# Patient Record
Sex: Male | Born: 1960 | Hispanic: No | Marital: Single | Smoking: Current every day smoker
Health system: Southern US, Community
[De-identification: ages and names within clinical notes are randomized; demographics above are authoritative.]

## PROBLEM LIST (undated history)

## (undated) DIAGNOSIS — F1010 Alcohol abuse, uncomplicated: Secondary | ICD-10-CM

## (undated) HISTORY — PX: TONSILLECTOMY: SUR1361

---

## 2007-02-03 ENCOUNTER — Emergency Department (HOSPITAL_COMMUNITY): Admission: EM | Admit: 2007-02-03 | Discharge: 2007-02-03 | Payer: Self-pay | Admitting: Emergency Medicine

## 2007-02-05 ENCOUNTER — Emergency Department (HOSPITAL_COMMUNITY): Admission: EM | Admit: 2007-02-05 | Discharge: 2007-02-05 | Payer: Self-pay | Admitting: Emergency Medicine

## 2012-03-16 DIAGNOSIS — F1010 Alcohol abuse, uncomplicated: Secondary | ICD-10-CM | POA: Insufficient documentation

## 2012-03-17 ENCOUNTER — Encounter (HOSPITAL_COMMUNITY): Payer: Self-pay

## 2012-03-17 ENCOUNTER — Emergency Department (HOSPITAL_COMMUNITY)
Admission: EM | Admit: 2012-03-17 | Discharge: 2012-03-17 | Discharge status: Against Medical Advice | Payer: Self-pay | Attending: Emergency Medicine | Admitting: Emergency Medicine

## 2012-03-17 NOTE — ED Notes (Signed)
Pt decides that he doesn't want detox and just needs to go home and sleep it off.

## 2012-03-17 NOTE — ED Notes (Signed)
Pt was here just a short time ago and stated that he didn't want detox he just needed to sober up, he wasn't suicidal and didn't want to see a Dr. Now he's back and states that he does want detox

## 2012-09-23 ENCOUNTER — Encounter (HOSPITAL_BASED_OUTPATIENT_CLINIC_OR_DEPARTMENT_OTHER): Payer: Self-pay | Admitting: Family Medicine

## 2012-09-23 ENCOUNTER — Emergency Department (HOSPITAL_BASED_OUTPATIENT_CLINIC_OR_DEPARTMENT_OTHER)
Admission: EM | Admit: 2012-09-23 | Discharge: 2012-09-23 | Disposition: A | Discharge status: Home / Self Care | Payer: Self-pay | Attending: Emergency Medicine | Admitting: Emergency Medicine

## 2012-09-23 DIAGNOSIS — Y93B3 Activity, free weights: Secondary | ICD-10-CM | POA: Insufficient documentation

## 2012-09-23 DIAGNOSIS — Y9239 Other specified sports and athletic area as the place of occurrence of the external cause: Secondary | ICD-10-CM | POA: Insufficient documentation

## 2012-09-23 DIAGNOSIS — S6990XA Unspecified injury of unspecified wrist, hand and finger(s), initial encounter: Secondary | ICD-10-CM | POA: Insufficient documentation

## 2012-09-23 DIAGNOSIS — Z888 Allergy status to other drugs, medicaments and biological substances status: Secondary | ICD-10-CM | POA: Insufficient documentation

## 2012-09-23 DIAGNOSIS — S59909A Unspecified injury of unspecified elbow, initial encounter: Secondary | ICD-10-CM | POA: Insufficient documentation

## 2012-09-23 DIAGNOSIS — T148XXA Other injury of unspecified body region, initial encounter: Secondary | ICD-10-CM

## 2012-09-23 DIAGNOSIS — F17200 Nicotine dependence, unspecified, uncomplicated: Secondary | ICD-10-CM | POA: Insufficient documentation

## 2012-09-23 DIAGNOSIS — Y92838 Other recreation area as the place of occurrence of the external cause: Secondary | ICD-10-CM | POA: Insufficient documentation

## 2012-09-23 NOTE — ED Notes (Signed)
Pt sts he was lifting weights on Sunday and injured left forearm. Pt has swelling noted to left lateral forearm and c/o pain x 4 days. Pt is currently a resident at [facility].

## 2012-09-23 NOTE — ED Provider Notes (Signed)
History     CSN: [id number]  Arrival date & time 09/23/12  1317   First MD Initiated Contact with Patient 09/23/12 1347      Chief Complaint  Patient presents with  . Arm Injury    (Consider location/radiation/quality/duration/timing/severity/associated sxs/prior treatment) HPI  Patient complaining of bilateral forearm pain after lifting weights on Sunday. He states he has not lifted weights for a long time. He did lots of curls. He noticed some pain in the muscles of the forearm on Monday with some swelling in bilateral forearms on Tuesday. He states he thinks this may be where the tendon and swollen. He's main numbness or tingling in his hands. He has no fever or trauma  History reviewed. No pertinent past medical history.  Past Surgical History  Procedure Date  . Tonsillectomy     No family history on file.  History  Substance Use Topics  . Smoking status: Current Every Day Smoker  . Smokeless tobacco: Not on file  . Alcohol Use: Yes     in rehab currently      Review of Systems  Constitutional: Negative for fever and chills.  HENT: Negative for neck stiffness.   Eyes: Negative for visual disturbance.  Respiratory: Negative for shortness of breath.   Cardiovascular: Negative for chest pain.  Gastrointestinal: Negative for vomiting, diarrhea and blood in stool.  Genitourinary: Negative for dysuria, frequency and decreased urine volume.  Musculoskeletal: Negative for myalgias and joint swelling.  Skin: Negative for rash.  Neurological: Negative for weakness.  Hematological: Negative for adenopathy.  Psychiatric/Behavioral: Negative for agitation.    Allergies  Prednisone  Home Medications  No current outpatient prescriptions on file.  BP 116/70  Pulse 100  Temp 98.7 F (37.1 C) (Oral)  Resp 18  Ht 5\' 9"  (1.753 m)  Wt 148 lb (67.132 kg)  BMI 21.86 kg/m2  SpO2 99%  Physical Exam  Nursing note and vitals reviewed. Constitutional: He is oriented to  person, place, and time. He appears well-developed and well-nourished.  HENT:  Head: Normocephalic and atraumatic.  Eyes: Conjunctivae normal and EOM are normal. Pupils are equal, round, and reactive to light.  Neck: Normal range of motion. Neck supple.  Cardiovascular: Normal rate and regular rhythm.   Pulmonary/Chest: Effort normal and breath sounds normal.  Abdominal: Soft.  Musculoskeletal:       Mild bilateral swelling right forearms with mild tenderness. Some tenderness with pronation and hands bilaterally.  Neurological: He is alert and oriented to person, place, and time. He has normal reflexes.  Skin: Skin is warm and dry.  Psychiatric: He has a normal mood and affect.    ED Course  Procedures (including critical care time)  Labs Reviewed - No data to display No results found.   No diagnosis found.           Hilario Quarry, MD 09/23/12 [phone]

## 2013-02-05 ENCOUNTER — Emergency Department (HOSPITAL_COMMUNITY)
Admission: EM | Admit: 2013-02-05 | Discharge: 2013-02-05 | Discharge status: Against Medical Advice | Payer: Self-pay | Attending: Emergency Medicine | Admitting: Emergency Medicine

## 2013-02-05 DIAGNOSIS — F1010 Alcohol abuse, uncomplicated: Secondary | ICD-10-CM | POA: Insufficient documentation

## 2013-02-05 NOTE — ED Notes (Signed)
Pt BIB EMS. [place] [employer]. Pt had a half bottle of whiskey with him. Pt told EMS that he wanted detox. Pt able to ambulate with ataxic gait.

## 2014-12-01 ENCOUNTER — Inpatient Hospital Stay (HOSPITAL_COMMUNITY)
Admission: AD | Admit: 2014-12-01 | Discharge: 2014-12-05 | DRG: 897 | Disposition: A | Discharge status: Home / Self Care | Payer: Federal, State, Local not specified - Other | Source: Intra-hospital | Attending: Psychiatry | Admitting: Psychiatry

## 2014-12-01 ENCOUNTER — Emergency Department (HOSPITAL_COMMUNITY)
Admission: EM | Admit: 2014-12-01 | Discharge: 2014-12-01 | Disposition: A | Discharge status: Other Acute Inpatient Hospital | Payer: Self-pay | Attending: Emergency Medicine | Admitting: Emergency Medicine

## 2014-12-01 ENCOUNTER — Ambulatory Visit (HOSPITAL_COMMUNITY)
Admission: RE | Admit: 2014-12-01 | Discharge: 2014-12-01 | Disposition: A | Discharge status: Home / Self Care | Payer: Federal, State, Local not specified - Other | Source: Home / Self Care | Attending: Psychiatry | Admitting: Psychiatry

## 2014-12-01 ENCOUNTER — Encounter (HOSPITAL_COMMUNITY): Payer: Self-pay

## 2014-12-01 ENCOUNTER — Encounter (HOSPITAL_COMMUNITY): Payer: Self-pay | Admitting: *Deleted

## 2014-12-01 DIAGNOSIS — Y900 Blood alcohol level of less than 20 mg/100 ml: Secondary | ICD-10-CM | POA: Diagnosis present

## 2014-12-01 DIAGNOSIS — F17210 Nicotine dependence, cigarettes, uncomplicated: Secondary | ICD-10-CM | POA: Diagnosis present

## 2014-12-01 DIAGNOSIS — F10230 Alcohol dependence with withdrawal, uncomplicated: Secondary | ICD-10-CM | POA: Insufficient documentation

## 2014-12-01 DIAGNOSIS — Z720 Tobacco use: Secondary | ICD-10-CM | POA: Insufficient documentation

## 2014-12-01 DIAGNOSIS — F1010 Alcohol abuse, uncomplicated: Secondary | ICD-10-CM

## 2014-12-01 DIAGNOSIS — F10239 Alcohol dependence with withdrawal, unspecified: Secondary | ICD-10-CM | POA: Diagnosis present

## 2014-12-01 DIAGNOSIS — F419 Anxiety disorder, unspecified: Secondary | ICD-10-CM | POA: Diagnosis present

## 2014-12-01 DIAGNOSIS — Z590 Homelessness: Secondary | ICD-10-CM | POA: Diagnosis not present

## 2014-12-01 DIAGNOSIS — G4700 Insomnia, unspecified: Secondary | ICD-10-CM | POA: Diagnosis present

## 2014-12-01 DIAGNOSIS — F1020 Alcohol dependence, uncomplicated: Secondary | ICD-10-CM | POA: Diagnosis present

## 2014-12-01 HISTORY — DX: Alcohol abuse, uncomplicated: F10.10

## 2014-12-01 LAB — COMPREHENSIVE METABOLIC PANEL
ALT: 38 U/L (ref 0–53)
AST: 34 U/L (ref 0–37)
Albumin: 3.7 g/dL (ref 3.5–5.2)
Alkaline Phosphatase: 117 U/L (ref 39–117)
Anion gap: 14 (ref 5–15)
BILIRUBIN TOTAL: 0.3 mg/dL (ref 0.3–1.2)
BUN: 9 mg/dL (ref 6–23)
CALCIUM: 9.3 mg/dL (ref 8.4–10.5)
CHLORIDE: 97 meq/L (ref 96–112)
CO2: 21 meq/L (ref 19–32)
CREATININE: 0.68 mg/dL (ref 0.50–1.35)
GLUCOSE: 121 mg/dL — AB (ref 70–99)
Potassium: 4.4 mEq/L (ref 3.7–5.3)
SODIUM: 132 meq/L — AB (ref 137–147)
Total Protein: 7.2 g/dL (ref 6.0–8.3)

## 2014-12-01 LAB — RAPID URINE DRUG SCREEN, HOSP PERFORMED
AMPHETAMINES: NOT DETECTED
BARBITURATES: NOT DETECTED
BENZODIAZEPINES: NOT DETECTED
Cocaine: NOT DETECTED
Opiates: NOT DETECTED
Tetrahydrocannabinol: NOT DETECTED

## 2014-12-01 LAB — ACETAMINOPHEN LEVEL: Acetaminophen (Tylenol), Serum: <15 ug/mL (ref 10–30)

## 2014-12-01 LAB — ETHANOL: Alcohol, Ethyl (B): <11 mg/dL (ref 0–11)

## 2014-12-01 LAB — CBC
HCT: 44 % (ref 39.0–52.0)
HEMOGLOBIN: 14.7 g/dL (ref 13.0–17.0)
MCH: 30.7 pg (ref 26.0–34.0)
MCHC: 33.4 g/dL (ref 30.0–36.0)
MCV: 91.9 fL (ref 78.0–100.0)
Platelets: 293 10*3/uL (ref 150–400)
RBC: 4.79 MIL/uL (ref 4.22–5.81)
RDW: 13.1 % (ref 11.5–15.5)
WBC: 6.5 10*3/uL (ref 4.0–10.5)

## 2014-12-01 LAB — SALICYLATE LEVEL

## 2014-12-01 MED ORDER — ZOLPIDEM TARTRATE 5 MG PO TABS
5.0000 mg | ORAL_TABLET | Freq: Every evening | ORAL | Status: DC | PRN
  Administered 2014-12-02 – 2014-12-04 (×3): 5 mg via ORAL
  Filled 2014-12-01 (×4): qty 1

## 2014-12-01 MED ORDER — LORAZEPAM 1 MG PO TABS
0.0000 mg | ORAL_TABLET | Freq: Four times a day (QID) | ORAL | Status: AC
Start: 2014-12-01 — End: 2014-12-03
  Administered 2014-12-02 – 2014-12-03 (×4): 1 mg via ORAL
  Filled 2014-12-01 (×3): qty 1

## 2014-12-01 MED ORDER — THIAMINE HCL 100 MG/ML IJ SOLN: 100.0000 mg | Freq: Every day | INTRAMUSCULAR | Status: DC

## 2014-12-01 MED ORDER — ZOLPIDEM TARTRATE 5 MG PO TABS: 5.0000 mg | ORAL_TABLET | Freq: Every evening | ORAL | Status: DC | PRN

## 2014-12-01 MED ORDER — ALUM & MAG HYDROXIDE-SIMETH 200-200-20 MG/5ML PO SUSP: 30.0000 mL | ORAL | Status: DC | PRN

## 2014-12-01 MED ORDER — LORAZEPAM 2 MG/ML IJ SOLN: 0.0000 mg | Freq: Four times a day (QID) | INTRAMUSCULAR | Status: DC

## 2014-12-01 MED ORDER — VITAMIN B-1 100 MG PO TABS
100.0000 mg | ORAL_TABLET | Freq: Every day | ORAL | Status: DC
  Administered 2014-12-02 – 2014-12-05 (×4): 100 mg via ORAL
  Filled 2014-12-01 (×6): qty 1

## 2014-12-01 MED ORDER — ONDANSETRON HCL 4 MG PO TABS: 4.0000 mg | ORAL_TABLET | Freq: Three times a day (TID) | ORAL | Status: DC | PRN

## 2014-12-01 MED ORDER — NICOTINE 21 MG/24HR TD PT24
21.0000 mg | MEDICATED_PATCH | Freq: Every day | TRANSDERMAL | Status: DC
  Administered 2014-12-02: 21 mg via TRANSDERMAL
  Filled 2014-12-01 (×4): qty 1

## 2014-12-01 MED ORDER — MAGNESIUM HYDROXIDE 400 MG/5ML PO SUSP: 30.0000 mL | Freq: Every day | ORAL | Status: DC | PRN

## 2014-12-01 MED ORDER — LORAZEPAM 1 MG PO TABS
0.0000 mg | ORAL_TABLET | Freq: Two times a day (BID) | ORAL | Status: AC
Start: 2014-12-03 — End: 2014-12-05
  Administered 2014-12-03 – 2014-12-05 (×4): 1 mg via ORAL
  Filled 2014-12-01 (×5): qty 1

## 2014-12-01 MED ORDER — LORAZEPAM 1 MG PO TABS: 0.0000 mg | ORAL_TABLET | Freq: Two times a day (BID) | ORAL | Status: DC

## 2014-12-01 MED ORDER — IBUPROFEN 200 MG PO TABS: 600.0000 mg | ORAL_TABLET | Freq: Three times a day (TID) | ORAL | Status: DC | PRN

## 2014-12-01 MED ORDER — ACETAMINOPHEN 325 MG PO TABS: 650.0000 mg | ORAL_TABLET | ORAL | Status: DC | PRN

## 2014-12-01 MED ORDER — LORAZEPAM 1 MG PO TABS
1.0000 mg | ORAL_TABLET | Freq: Three times a day (TID) | ORAL | Status: DC | PRN
  Administered 2014-12-02 – 2014-12-03 (×2): 1 mg via ORAL

## 2014-12-01 MED ORDER — ACETAMINOPHEN 325 MG PO TABS
650.0000 mg | ORAL_TABLET | Freq: Four times a day (QID) | ORAL | Status: DC | PRN
  Administered 2014-12-03: 650 mg via ORAL
  Filled 2014-12-01: qty 2

## 2014-12-01 MED ORDER — NICOTINE 21 MG/24HR TD PT24: 21.0000 mg | MEDICATED_PATCH | Freq: Every day | TRANSDERMAL | Status: DC

## 2014-12-01 MED ORDER — LORAZEPAM 2 MG/ML IJ SOLN: 0.0000 mg | Freq: Two times a day (BID) | INTRAMUSCULAR | Status: DC

## 2014-12-01 MED ORDER — VITAMIN B-1 100 MG PO TABS: 100.0000 mg | ORAL_TABLET | Freq: Every day | ORAL | Status: DC

## 2014-12-01 MED ORDER — LORAZEPAM 1 MG PO TABS: 0.0000 mg | ORAL_TABLET | Freq: Four times a day (QID) | ORAL | Status: DC

## 2014-12-01 MED ORDER — LORAZEPAM 1 MG PO TABS: 1.0000 mg | ORAL_TABLET | Freq: Three times a day (TID) | ORAL | Status: DC | PRN

## 2014-12-01 NOTE — BH Assessment (Addendum)
Tele Assessment Note   Jay Potts is an 53 y.o. male who [facility].  He says he has been drinking between 12-20 high content beers daily for the past few months. Pt state she also smoked crack and marijuana a few times int he past few weeks, which he says he never does.  Pt denies history of seizures or DTs, and last drank at 2 am this morning.  He says he has [facility], and then went to AA, but he "does not fit in there'. He has no OP providers.  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  He has little family support because his parents are in an assisted living facility, and all of his siblings live in [place].  Pt denies depressive symptoms, says he has normal sleep and eating patterns and denies A/V hallucinations.  He was calm and cooperative during assessment with normal speech pattern, movement and thought content and did not appear to responding to internal stimuli.  Freeman CaldronShelli, NP, [facility] and [facility].  Axis I: Alcohol Abuse Axis II: Deferred Axis III: No past medical history on file. Axis IV: economic problems, housing problems and problems with primary support group Axis V: 41-50 serious symptoms  Past Medical History: No past medical history on file.  Past Surgical History  Procedure Laterality Date  . Tonsillectomy      Family History: No family history on file.  Social History:  reports that he has been smoking.  He does not have any smokeless tobacco history on file. He reports that he drinks alcohol. He reports that he uses illicit drugs (Marijuana).  Additional Social History:  Alcohol / Drug Use Pain Medications: denies Prescriptions: denies Over the Counter: denies History of alcohol / drug use?: Yes Longest period of sobriety (when/how long): 5 years Negative Consequences of Use: Financial, Personal relationships,  Work / School Withdrawal Symptoms:  (none currently) Substance #1 Name of Substance 1: alcohol 1 - Age of First Use: 16 1 - Amount (size/oz): 12-20 high alcohol beers/day 1 - Frequency: daily 1 - Duration: months 1 - Last Use / Amount: from 6pm to 2am, @20  beers  CIWA:   COWS:    PATIENT STRENGTHS: (choose at least two) Ability for insight Average or above average intelligence Capable of independent living Psychologist, counsellingCommunication skills Financial means Work skills  Allergies:  Allergies  Allergen Reactions  . Prednisone     Home Medications:  (Not in a hospital admission)  OB/GYN Status:  No LMP for male patient.  General Assessment Data Location of Assessment: [facility] Assessment Services Is this an Initial Assessment or a Re-assessment for this encounter?: Initial Assessment Living Arrangements: Alone (homeless) Can pt return to current living arrangement?: Yes Admission Status: Voluntary Is patient capable of signing voluntary admission?: Yes Transfer from: Home Referral Source: Self/Family/Friend  Medical Screening Exam [facility] Walk-in ONLY) Medical Exam completed: No Reason for MSE not completed:  ([facility])  [facility] Living Arrangements: Alone (homeless) Name of Psychiatrist:  (none) Name of Therapist: none  Education Status Is patient currently in school?: No  Risk to self with the past 6 months Suicidal Ideation: No Suicidal Intent: No Is patient at risk for suicide?: No Suicidal Plan?: No Access to Means: No What has been your use of drugs/alcohol within the  last 12 months?:  (see SA section) Previous Attempts/Gestures: No Other Self Harm Risks:  (none known) Intentional Self Injurious Behavior: None Family Suicide History: No Recent stressful life event(s): Financial Problems, Job Loss Persecutory voices/beliefs?: No Depression: Yes Depression Symptoms:  (pt denies) Substance abuse history and/or treatment for  substance abuse?: Yes Suicide prevention information given to non-admitted patients: Not applicable  Risk to Others within the past 6 months Homicidal Ideation: No Thoughts of Harm to Others: No Current Homicidal Intent: No Current Homicidal Plan: No Access to Homicidal Means: No History of harm to others?: No Assessment of Violence: None Noted Does patient have access to weapons?: No Criminal Charges Pending?: No Does patient have a court date: No  Psychosis Hallucinations: None noted Delusions: None noted  Mental Status Report Appear/Hygiene: Disheveled Eye Contact: Good Motor Activity: Unremarkable Speech: Logical/coherent Level of Consciousness: Alert Mood:  (appropriate to circumstance) Affect: Appropriate to circumstance Anxiety Level: None Thought Processes: Coherent, Relevant Judgement: Partial Orientation: Person, Place, Time, Situation, Appropriate for developmental age Obsessive Compulsive Thoughts/Behaviors: None  Cognitive Functioning Concentration: Normal Memory: Recent Intact, Remote Intact IQ: Average Insight: Fair Impulse Control: Fair Appetite: Fair Weight Loss: 0 Weight Gain: 0 Sleep: No Change Total Hours of Sleep: 8 Vegetative Symptoms: Decreased grooming  ADLScreening [facility] Assessment Services) Patient's cognitive ability adequate to safely complete daily activities?: Yes Patient able to express need for assistance with ADLs?: Yes Independently performs ADLs?: Yes (appropriate for developmental age)  Prior Inpatient Therapy Prior Inpatient Therapy: Yes Prior Therapy Dates:  (June 2015) Prior Therapy Facilty/Provider(s):  ([facility]) Reason for Treatment:  (detox)  Prior Outpatient Therapy Prior Outpatient Therapy: Yes ([facility]) Prior Therapy Dates:  (June 2015-present') Prior Therapy Facilty/Provider(s):  ([facility]) Reason for Treatment:  (ETOH)  ADL Screening (condition at time of admission) Patient's cognitive ability adequate to safely complete  daily activities?: Yes Is the patient deaf or have difficulty hearing?: No Does the patient have difficulty seeing, even when wearing glasses/contacts?: No Does the patient have difficulty concentrating, remembering, or making decisions?: No Patient able to express need for assistance with ADLs?: Yes Does the patient have difficulty dressing or bathing?: No Independently performs ADLs?: Yes (appropriate for developmental age) Does the patient have difficulty walking or climbing stairs?: No  Home Assistive Devices/Equipment Home Assistive Devices/Equipment: None    Abuse/Neglect Assessment (Assessment to be complete while patient is alone) Physical Abuse: Denies Verbal Abuse: Denies Sexual Abuse: Denies Exploitation of patient/patient's resources: Denies Self-Neglect: Denies          Additional Information 1:1 In Past 12 Months?: No CIRT Risk: No Elopement Risk: No Does patient have medical clearance?: No     Disposition:  Disposition Initial Assessment Completed for this Encounter: Yes Disposition of Patient: Inpatient treatment program  [facility],Lindon Kiel Hines 12/01/2014 11:47 AM

## 2014-12-01 NOTE — Tx Team (Signed)
Initial Interdisciplinary Treatment Plan   PATIENT STRESSORS: Financial difficulties Substance abuse   PATIENT STRENGTHS: Ability for insight Average or above average intelligence Communication skills   PROBLEM LIST: Problem List/Patient Goals Date to be addressed Date deferred Reason deferred Estimated date of resolution  Alcohol abuse 12/01/2014                                                      DISCHARGE CRITERIA:  Verbal commitment to aftercare and medication compliance Withdrawal symptoms are absent or subacute and managed without 24-hour nursing intervention  PRELIMINARY DISCHARGE PLAN: Attend aftercare/continuing care group  PATIENT/FAMIILY INVOLVEMENT: This treatment plan has been presented to and reviewed with the patient, Jay Potts.  The patient and family have been given the opportunity to ask questions and make suggestions.  Jay Potts, Jay Potts 12/01/2014, 7:03 PM

## 2014-12-01 NOTE — BH Assessment (Signed)
[facility].   TTS Elijah Birkom will fax the support paperwork.

## 2014-12-01 NOTE — BH Assessment (Signed)
[facility] Assessment Progress Note  Jay Potts, Jay Potts was informed by Phillip HealAva Stevenson, Jay Potts that pt has been [facility], Rm 301-1, by Bonnetta BarryShelly Eisbach, NP.  Pt signed Voluntary Admission and Consent for Treatment, as well as Consent to Release Information.  S[facility].  Pt's nurse, Carlisle BeersLuann, has been informed.  She agrees to send original paperwork with pt via Juel [place], and to call report to [phone].  Doylene Canninghomas Bailee Metter, MA Triage Specialist 12/01/2014 @ 14:27

## 2014-12-01 NOTE — BH Assessment (Addendum)
Per Burnett HarryShelly, NP patient meets criteria for inpatient hospitalization.   TTS Irving Burtonmily will refer patient to [facility] and [facility].   [facility] been informed that the patient is there.  TTS Elijah Birkom will follow up on the status of referral.

## 2014-12-01 NOTE — Progress Notes (Signed)
Patient attended AA group tonight. 

## 2014-12-01 NOTE — Progress Notes (Signed)
D.  Pt pleasant on approach, denies withdrawal signs or symptoms at this time.  Denies SI/HI/hallucinations at this time.  Positive for evening AA group, interacting appropriately with peers on the unit.  A.  Support and encouragement offered  R.  Pt remains safe on unit, will continue to monitor.

## 2014-12-01 NOTE — ED Notes (Signed)
Pt reports intermittent alcohol abuse x30. Went to rehab 6 months ago. Has been drink x5 months. Lately has been drinking 12-18 drinks/day. Smoked crack cocaine last night. Smokes marijauna 1x/month. Denies pain. Last ETOH at 0200. Reports usually can go 2 days without drinking and on third day his "body starts knotting up".

## 2014-12-01 NOTE — Progress Notes (Signed)
Pt admitted to unit. Skin search unremarkable -- some possible toe fungus, scratches around ankle (pt thinks from detergent that irritated skin). Belongings inventoried, rules reviewed, and meal given. Pt denies SI, HI, a/v disturbances. Says his only complaint is substance abuse -- here for alcohol detox. CIWA 1 upon admission for slight tremor. ("You should have seen me this morning," pt says.) Pt is cooperative, polite, and pleasant. Pt reports no hx of seizures and says his worst withdrawal symptoms come about 3 days after not drinking.

## 2014-12-01 NOTE — ED Provider Notes (Signed)
CSN: [id number]     Arrival date & time 12/01/14  1138 History   First MD Initiated Contact with Patient 12/01/14 1204     Chief Complaint  Patient presents with  . ETOH detox      (Consider location/radiation/quality/duration/timing/severity/associated sxs/prior Treatment) Patient is a 53 y.o. male presenting with alcohol problem. The history is provided by the patient.  Alcohol Problem This is a chronic problem. The current episode started more than 1 week ago. The problem occurs constantly. The problem has been gradually worsening. Pertinent negatives include no abdominal pain and no shortness of breath. Nothing aggravates the symptoms. Nothing relieves the symptoms. He has tried nothing for the symptoms.    Past Medical History  Diagnosis Date  . Alcohol abuse    Past Surgical History  Procedure Laterality Date  . Tonsillectomy     History reviewed. No pertinent family history. History  Substance Use Topics  . Smoking status: Current Every Day Smoker  . Smokeless tobacco: Not on file  . Alcohol Use: Yes     Comment: ETOH abuse x30 years, here for med clearance Dec 2015    Review of Systems  Constitutional: Negative for fever and chills.  Respiratory: Negative for cough and shortness of breath.   Gastrointestinal: Negative for vomiting and abdominal pain.  All other systems reviewed and are negative.     Allergies  Prednisone  Home Medications   Prior to Admission medications   Not on File   BP 142/84 mmHg  Pulse 64  Temp(Src) 98.2 F (36.8 C) (Oral)  Resp 16  SpO2 98% Physical Exam  Constitutional: He is oriented to person, place, and time. He appears well-developed and well-nourished. No distress.  HENT:  Head: Normocephalic and atraumatic.  Mouth/Throat: Oropharynx is clear and moist. No oropharyngeal exudate.  Eyes: EOM are normal. Pupils are equal, round, and reactive to light.  Neck: Normal range of motion. Neck supple.  Cardiovascular: Normal  rate and regular rhythm.  Exam reveals no friction rub.   No murmur heard. Pulmonary/Chest: Effort normal and breath sounds normal. No respiratory distress. He has no wheezes. He has no rales.  Abdominal: Soft. He exhibits no distension. There is no tenderness. There is no rebound.  Musculoskeletal: Normal range of motion. He exhibits no edema.  Neurological: He is alert and oriented to person, place, and time. No cranial nerve deficit. He exhibits normal muscle tone. Coordination normal.  Skin: No rash noted. He is not diaphoretic.  Nursing note and vitals reviewed.   ED Course  Procedures (including critical care time) Labs Review Labs Reviewed  COMPREHENSIVE METABOLIC PANEL - Abnormal; Notable for the following:    Sodium 132 (*)    Glucose, Bld 121 (*)    All other components within normal limits  SALICYLATE LEVEL - Abnormal; Notable for the following:    Salicylate Lvl <2.0 (*)    All other components within normal limits  ACETAMINOPHEN LEVEL  CBC  ETHANOL  URINE RAPID DRUG SCREEN (HOSP PERFORMED)    Imaging Review No results found.   EKG Interpretation None      MDM   Final diagnoses:  None    45M here wanting alcohol detox. Did inpatient program earlier this year, has been drinking heavily for several months. States relapse was because he stopped going to meetings. AFVSS here. No hx of seizures with alcohol withdrawal, just severe shakiness. Will consult TTS. CIWA protocol initiated.    Elwin MochaBlair Bert Ptacek, MD 12/01/14 [phone]

## 2014-12-02 ENCOUNTER — Encounter (HOSPITAL_COMMUNITY): Payer: Self-pay | Admitting: Psychiatry

## 2014-12-02 DIAGNOSIS — F1010 Alcohol abuse, uncomplicated: Secondary | ICD-10-CM

## 2014-12-02 NOTE — [facility] Suicide Risk Assessment (Signed)
Suicide Risk Assessment  Admission Assessment     Nursing information obtained from:    Demographic factors:    Current Mental Status:    Loss Factors:    Historical Factors:    Risk Reduction Factors:    Total Time spent with patient: 45 minutes  CLINICAL FACTORS:   Alcohol/Substance Abuse/Dependencies  Psychiatric Specialty Exam:     Blood pressure 118/58, pulse 61, temperature 98.7 F (37.1 C), temperature source Oral, resp. rate 18, height 5' 7.32" (1.71 m), weight 69.174 kg (152 lb 8 oz), SpO2 100 %.Body mass index is 23.66 kg/(m^2).  General Appearance: Fairly Groomed  Patent attorneyye Contact::  Fair  Speech:  Clear and Coherent  Volume:  Normal  Mood:  Anxious and worried  Affect:  anxious, worried  Thought Process:  Coherent and Goal Directed  Orientation:  Full (Time, Place, and Person)  Thought Content:  symptoms events worries concerns  Suicidal Thoughts:  No  Homicidal Thoughts:  No  Memory:  Immediate;   Fair Recent;   Fair Remote;   Fair  Judgement:  Fair  Insight:  Present  Psychomotor Activity:  Restlessness  Concentration:  Fair  Recall:  FiservFair  Fund of Knowledge:Fair  Language: Fair  Akathisia:  No  Handed:    AIMS (if indicated):     Assets:  Desire for Improvement  Sleep:  Number of Hours: 6.75   Musculoskeletal: Strength & Muscle Tone: within normal limits Gait & Station: normal Patient leans: N/A  COGNITIVE FEATURES THAT CONTRIBUTE TO RISK:  Closed-mindedness Polarized thinking Thought constriction (tunnel vision)    SUICIDE RISK:   Moderate:   PLAN OF CARE: Supportive approach/coping skills/relapse prevention                               Alcohol dependence: will detox with Ativan, will present the use of Campral to help the cravings                               Will reassess and address any other co morbidities  I certify that inpatient services furnished can reasonably be expected to improve the patient's condition.  Tieisha Darden  A 12/02/2014, 5:42 PM

## 2014-12-02 NOTE — Progress Notes (Signed)
Adult Psychoeducational Group Note  Date:  12/02/2014 Time:  6:20 PM  Group Topic/Focus: Music Activity   Participation Level:  Active  Participation Quality:  Appropriate  Affect:  Appropriate  Cognitive:  Appropriate  Insight: Appropriate  Engagement in Group:  Engaged  Modes of Intervention:  Activity  Additional Comments:   Jay Potts A 12/02/2014, 6:20 PM

## 2014-12-02 NOTE — [facility] Group Notes (Signed)
[facility] LCSW Group Therapy Note  12/02/2014 1:15 PM   Type of Therapy and Topic:  Group Therapy: Avoiding Self-Sabotaging and Enabling Behaviors  Participation Level:  Moderate  Mood: Appropriate  Description of Group:     Learn how to identify obstacles, self-sabotaging and enabling behaviors, what are they, why do we do them and what needs do these behaviors meet? Discuss unhealthy relationships and how to have positive healthy boundaries with those that sabotage and enable. Explore aspects of self-sabotage and enabling in yourself and how to limit these self-destructive behaviors in everyday life. A scaling question is used to help patient look at where they are now in their motivation to change, from 1 to 10 (lowest to highest motivation).  Therapeutic Goals: 1. Patient will identify one obstacle that relates to self-sabotage and enabling behaviors 2. Patient will identify one personal self-sabotaging or enabling behavior they did prior to admission 3. Patient able to establish a plan to change the above identified behavior they did prior to admission:  4. Patient will demonstrate ability to communicate their needs through discussion and/or role plays.   Summary of Patient Progress:  Summary of Progress/Problems: The main focus of today's process group was for the patient to identify ways in which they have in the past sabotaged their own recovery. Motivational Interviewing was utilized to ask the group members what they get out of their substance use, isolation, noncompliance, self harm, negative self talk, etc and what reasons they may have for wanting to change. The Stages of Change were explained using a handout, and patients identified where they currently are with regard to stages of change. The patient expressed that he self sabotages by alcohol use and he feels quite hopeless about this although he reported during warm up his desire to return to school. He reports he is in  contemplation and hopes to get into another substance abuse treatment program.    Therapeutic Modalities:   Cognitive Behavioral Therapy Person-Centered Therapy Motivational Interviewing   Carney Bernatherine C Micala Saltsman, LCSW

## 2014-12-02 NOTE — Progress Notes (Signed)
[facility] Group Notes:  (Nursing/MHT/Case Management/Adjunct)  Date:  12/02/2014  Time:  11:58 PM  Type of Therapy:  Psychoeducational Skills  Participation Level:  Minimal  Participation Quality:  Appropriate  Affect:  Appropriate  Cognitive:  Appropriate  Insight:  Appropriate  Engagement in Group:  Developing/Improving  Modes of Intervention:  Education  Summary of Progress/Problems: The patient attended the A.A. Meeting and was appropriate.   Hazle CocaGOODMAN, Tamie Minteer S 12/02/2014, 11:58 PM

## 2014-12-02 NOTE — H&P (Signed)
Psychiatric Admission Assessment Adult  Patient Identification:  Jay Potts Date of Evaluation:  12/02/2014 Chief Complaint:  ALCOHOL ABUSE History of Present Illness::  Caucasian male, 53 years old was evaluated this am for Alcohol dependence issue.  Patient started drinking off and on at age 40.  He has been treated for Alcoholism at [facility] and [facility].  Patient was at [facility] early this year  In July but relapsed and started drinking  Two weeks after his release.  Patient reports drinking 12-14  Beers daily.  Patient stated that he tried using Cocaine once in his life last week with friends and states that he will not use it again.  He uses Marijuana once a month only when drinking with his friends.  Patient denies Alcohol withdrawal seizures but stated that he drinks until he falls asleep at times.  Patient reports good appetite and sleep.  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  He reported that his brother had problem with drinking and had Liver failure.  He ended up receiving a Liver transplant.  Patien plans to join [place] meeting after his discharge from here.  Patients that he has a better attitude toward recovery now and does not want to be like his brother.  He denies SI/HI/AVH.  He denies previous suicide attempt.  He is being detoxed with our Ativan protocol.  He denies previous MH diagnosis and is not taking any medications.  Elements:  Location:  Alcohol dependence. Quality:  DRINKS 12-14 BEER DAILY, IS HOMELESS, LOST JOB, LOST CONTACT WITH FAMILY.Marland Kitchen Severity:  Severe. Duration:  Have been drinking off and on since age 68. Context:  Seeking detox treatment, want to change this life style.. Associated Signs/Synptoms: Depression Symptoms:   (Hypo) Manic Symptoms:   Anxiety Symptoms:   Psychotic Symptoms:   PTSD Symptoms:  Total Time spent with patient: 1 hour  Psychiatric Specialty Exam: Physical Exam  Review of Systems  Constitutional:  Negative.   HENT: Negative.   Eyes: Negative.   Respiratory: Negative.   Cardiovascular: Negative.   Gastrointestinal: Negative.   Genitourinary: Negative.   Musculoskeletal: Negative.   Skin: Negative.   Neurological: Negative.   Endo/Heme/Allergies: Negative.   Psychiatric/Behavioral: Positive for substance abuse (Have had issues with drinking and recently used Cocaine and Marijuana). Negative for depression, suicidal ideas, hallucinations and memory loss. The patient is not nervous/anxious and does not have insomnia.     Blood pressure 116/69, pulse 80, temperature 98.7 F (37.1 C), temperature source Oral, resp. rate 18, height 5' 7.32" (1.71 m), weight 69.174 kg (152 lb 8 oz), SpO2 100 %.Body mass index is 23.66 kg/(m^2).  General Appearance: Casual  Eye Contact::  Good  Speech:  Clear and Coherent and Normal Rate  Volume:  Normal  Mood:  Depressed, Hopeless and Worthless  Affect:  Congruent, Depressed and Flat  Thought Process:  Coherent, Goal Directed and Intact  Orientation:  Full (Time, Place, and Person)  Thought Content:  WDL  Suicidal Thoughts:  No  Homicidal Thoughts:  No  Memory:  Immediate;   Good Recent;   Good Remote;   Good  Judgement:  Fair  Insight:  Fair  Psychomotor Activity:  Tremor  Concentration:  Good  Recall:  NA  Fund of Knowledge:Good  Language: Good  Akathisia:  NA  Handed:  Right  AIMS (if indicated):     Assets:  Desire for Improvement Housing  Sleep:  Number of Hours: 6.75  Musculoskeletal: Strength & Muscle Tone: within normal limits Gait & Station: normal Patient leans: N/A  Past Psychiatric History: Diagnosis:  Hospitalizations:  Outpatient Care: none  Substance Abuse Care: [facility], [facility]  Self-Mutilation: Denies  Suicidal Attempts: Denies  Violent Behaviors: Denies   Past Medical History:   Past Medical History  Diagnosis Date  . Alcohol abuse    None. Allergies:   Allergies  Allergen Reactions  . Prednisone  Palpitations   PTA Medications: No prescriptions prior to admission    Previous Psychotropic Medications:  Medication/Dose                 Substance Abuse History in the last 12 months:  Yes.    Consequences of Substance Abuse: NA  Social History:  reports that he has been smoking.  He does not have any smokeless tobacco history on file. He reports that he drinks alcohol. He reports that he uses illicit drugs (Marijuana and Cocaine). Additional Social History:  Current Place of Residence:   Place of Birth:   Family Members: Marital Status:  Single Children:  Sons:  Daughters: Relationships: Education:  [employer] Problems/Performance: Religious Beliefs/Practices: History of Abuse (Emotional/Phsycial/Sexual) Ship broker History:  None. Legal History: Hobbies/Interests:  Family History:  History reviewed. No pertinent family history.  Results for orders placed or performed during the hospital encounter of 12/01/14 (from the past 72 hour(s))  Acetaminophen level     Status: None   Collection Time: 12/01/14 12:03 PM  Result Value Ref Range   Acetaminophen (Tylenol), Serum <15.0 10 - 30 ug/mL    Comment:        THERAPEUTIC CONCENTRATIONS VARY SIGNIFICANTLY. A RANGE OF 10-30 ug/mL MAY BE AN EFFECTIVE CONCENTRATION FOR MANY PATIENTS. HOWEVER, SOME ARE BEST TREATED AT CONCENTRATIONS OUTSIDE THIS RANGE. ACETAMINOPHEN CONCENTRATIONS >150 ug/mL AT 4 HOURS AFTER INGESTION AND >50 ug/mL AT 12 HOURS AFTER INGESTION ARE OFTEN ASSOCIATED WITH TOXIC REACTIONS.   CBC     Status: None   Collection Time: 12/01/14 12:03 PM  Result Value Ref Range   WBC 6.5 4.0 - 10.5 K/uL   RBC 4.79 4.22 - 5.81 MIL/uL   Hemoglobin 14.7 13.0 - 17.0 g/dL   HCT 44.0 39.0 - 52.0 %   MCV 91.9 78.0 - 100.0 fL   MCH 30.7 26.0 - 34.0 pg   MCHC 33.4 30.0 - 36.0 g/dL   RDW 13.1 11.5 - 15.5 %   Platelets 293 150 - 400 K/uL  Comprehensive metabolic panel      Status: Abnormal   Collection Time: 12/01/14 12:03 PM  Result Value Ref Range   Sodium 132 (L) 137 - 147 mEq/L   Potassium 4.4 3.7 - 5.3 mEq/L   Chloride 97 96 - 112 mEq/L   CO2 21 19 - 32 mEq/L   Glucose, Bld 121 (H) 70 - 99 mg/dL   BUN 9 6 - 23 mg/dL   Creatinine, Ser 0.68 0.50 - 1.35 mg/dL   Calcium 9.3 8.4 - 10.5 mg/dL   Total Protein 7.2 6.0 - 8.3 g/dL   Albumin 3.7 3.5 - 5.2 g/dL   AST 34 0 - 37 U/L   ALT 38 0 - 53 U/L   Alkaline Phosphatase 117 39 - 117 U/L   Total Bilirubin 0.3 0.3 - 1.2 mg/dL   GFR calc non Af Amer >90 >90 mL/min   GFR calc Af Amer >90 >90 mL/min    Comment: (NOTE) The eGFR has been calculated using the CKD  EPI equation. This calculation has not been validated in all clinical situations. eGFR's persistently <90 mL/min signify possible Chronic Kidney Disease.    Anion gap 14 5 - 15  Ethanol (ETOH)     Status: None   Collection Time: 12/01/14 12:03 PM  Result Value Ref Range   Alcohol, Ethyl (B) <11 0 - 11 mg/dL    Comment:        LOWEST DETECTABLE LIMIT FOR SERUM ALCOHOL IS 11 mg/dL FOR MEDICAL PURPOSES ONLY   Salicylate level     Status: Abnormal   Collection Time: 12/01/14 12:03 PM  Result Value Ref Range   Salicylate Lvl <7.8 (L) 2.8 - 20.0 mg/dL  Urine Drug Screen     Status: None   Collection Time: 12/01/14 12:12 PM  Result Value Ref Range   Opiates NONE DETECTED NONE DETECTED   Cocaine NONE DETECTED NONE DETECTED   Benzodiazepines NONE DETECTED NONE DETECTED   Amphetamines NONE DETECTED NONE DETECTED   Tetrahydrocannabinol NONE DETECTED NONE DETECTED   Barbiturates NONE DETECTED NONE DETECTED    Comment:        DRUG SCREEN FOR MEDICAL PURPOSES ONLY.  IF CONFIRMATION IS NEEDED FOR ANY PURPOSE, NOTIFY LAB WITHIN 5 DAYS.        LOWEST DETECTABLE LIMITS FOR URINE DRUG SCREEN Drug Class       Cutoff (ng/mL) Amphetamine      1000 Barbiturate      200 Benzodiazepine   938 Tricyclics       101 Opiates          300 Cocaine           300 THC              50    Psychological Evaluations:  Assessment:   DSM5:  Schizophrenia Disorders:   Obsessive-Compulsive Disorders:   Trauma-Stressor Disorders:  Substance/Addictive Disorders:  Alcohol Related Disorder - Severe (303.90) Depressive Disorders:      Past Medical History  Diagnosis Date  . Alcohol abuse      Treatment Plan/Recommendations:   Admit for crisis management/stabilization. Review and reinstate any pertinent home medications for other health issues.   Medication management to treat current mood problems Continue Alcohol detox with our Ativan Protocol Group counseling sessions and activities. Primary care consults as needed. Continue current treatment plan .  Treatment Plan Summary: Daily contact with patient to assess and evaluate symptoms and progress in treatment Medication management Current Medications:  Current Facility-Administered Medications  Medication Dose Route Frequency Provider Last Rate Last Dose  . acetaminophen (TYLENOL) tablet 650 mg  650 mg Oral Q6H PRN Waylan Boga, NP      . alum & mag hydroxide-simeth (MAALOX/MYLANTA) 200-200-20 MG/5ML suspension 30 mL  30 mL Oral Q4H PRN Waylan Boga, NP      . LORazepam (ATIVAN) tablet 0-4 mg  0-4 mg Oral 4 times per day Waylan Boga, NP   1 mg at 12/02/14 7510   Followed by  . [START ON 12/03/2014] LORazepam (ATIVAN) tablet 0-4 mg  0-4 mg Oral Q12H Waylan Boga, NP      . LORazepam (ATIVAN) tablet 1 mg  1 mg Oral Q8H PRN Waylan Boga, NP      . magnesium hydroxide (MILK OF MAGNESIA) suspension 30 mL  30 mL Oral Daily PRN Waylan Boga, NP      . nicotine (NICODERM CQ - dosed in mg/24 hours) patch 21 mg  21 mg Transdermal Daily Waylan Boga, NP      .  ondansetron (ZOFRAN) tablet 4 mg  4 mg Oral Q8H PRN Waylan Boga, NP      . thiamine (VITAMIN B-1) tablet 100 mg  100 mg Oral Daily Waylan Boga, NP       Or  . thiamine (B-1) injection 100 mg  100 mg Intravenous Daily Waylan Boga, NP       . zolpidem (AMBIEN) tablet 5 mg  5 mg Oral QHS PRN Waylan Boga, NP        Observation Level/Precautions:  15 minute checks  Laboratory:  CBC Chemistry Profile HbAIC UDS Alcohol level  Psychotherapy:  Encouraged to attend group therapy  Medications:    Consultations:  As needed  Discharge Concerns:  Relapse on Alcohol  Estimated LOS: 2-5 days  Other:     I certify that inpatient services furnished can reasonably be expected to improve the patient's condition.   Charmaine Downs, C  PMHNP-BC 12/19/201510:25 AM I personally assessed the patient, reviewed the physical exam and labs and formulated the treatment plan Geralyn Flash A. Sabra Heck, M.D.

## 2014-12-02 NOTE — Progress Notes (Signed)
D.  Pt pleasant on approach, states he thinks he will try something tonight for sleep.  Denies SI/HI/hallucinations or s/s of withdrawal at this time.  Positive for evening AA group, interacting appropriately with peers on unit.  A.  Support and encouragement offered, medication given as ordered to promote sleep.  R.  Pt remains safe on unit, will continue to monitor.

## 2014-12-02 NOTE — Progress Notes (Signed)
Jay Potts is handling his detox well today. He is interacting with the other patients. He completes his self inventory and on it he wrote  He denies SI within the past 24 hrs and he rated his depression, hopelessness and anxiety "0/0/2", respectively.   A HE attends his groups as planned today and has not requested extra medicine for his withdrawal. HE stated he was in a place of " mellow'", after receivin his first dose of ativan this morning.   R He us beginning to be honest about his illness as evidienced by introducing himslef as an " alcoholic who needs help". Safety in place.Marland Kitchen..Marland Kitchen

## 2014-12-03 DIAGNOSIS — F1020 Alcohol dependence, uncomplicated: Secondary | ICD-10-CM

## 2014-12-03 MED ORDER — NICOTINE 21 MG/24HR TD PT24
21.0000 mg | MEDICATED_PATCH | Freq: Every day | TRANSDERMAL | Status: DC
  Administered 2014-12-03 – 2014-12-05 (×3): 21 mg via TRANSDERMAL
  Filled 2014-12-03 (×5): qty 1

## 2014-12-03 NOTE — [facility] Group Notes (Signed)
[facility] Group Notes:  (Nursing/MHT/Case Management/Adjunct)  Date:  12/03/2014  Time:  1030am  Type of Therapy:  Life Skills Group  Participation Level:  Active  Participation Quality:  Appropriate and Attentive  Affect:  Appropriate  Cognitive:  Alert and Appropriate  Insight:  Appropriate and Good  Engagement in Group:  Engaged  Modes of Intervention:  Discussion, Education and Support  Summary of Progress/Problems: Patient was engaged in group and responded appropriately.  Laurine BlazerGuthrie, [place]Br[place]any A 12/03/2014, 2:22 PM

## 2014-12-03 NOTE — B[facility] Counselor (Signed)
Adult Comprehensive Assessment  Patient ID: Jay Potts, male   DOB: 09/26/1961, 53 y.o.   MRN: [id number]  Information Source: Information source: Patient  Current Stressors:  Educational / Learning stressors: NA Employment / Job issues: Unemployed Family Relationships: Strained with adoptive siblings; adoptive parents are both in assisted living and offer emotional support; estranged from birth siblings in [place] Financial / Lack of resources (include bankruptcy): Strained due to lack of employment Housing / Lack of housing: Homeless since loss of employment 8/15 Physical health (include injuries & life threatening diseases): Hernia Social relationships: Isolative Substance abuse: Ongoing Bereavement / Loss: Multiple relapses  Living/Environment/Situation:  Living Arrangements: Alone, Other (Comment) (Homeless) Living conditions (as described by patient or guardian): Homeless, sleeping on streets since loss of employment August 2015 How long has patient lived in current situation?: 4 months What is atmosphere in current home: Temporary, Chaotic  Family History:  Marital status: Single Does patient have children?: No  Childhood History:  By whom was/is the patient raised?: Adoptive parents Additional childhood history information: Patient reports he was "the prodigal adoptive son"  Description of patient's relationship with caregiver when they were a child: Good with adoptive parents although father was stern Patient's description of current relationship with people who raised him/her: "Great" Does patient have siblings?: Yes Number of Siblings: 2 Description of patient's current relationship with siblings: 2 adoptive siblings in area; patient reports he is resented by both; estranged from biological siblings in [place] Did patient suffer any verbal/emotional/physical/sexual abuse as a child?: No ("some tense moments") Did patient suffer from severe childhood neglect?: No Has  patient ever been sexually abused/assaulted/raped as an adolescent or adult?: No (Patient reports intense bullying when high school student at [employer]) Was the patient ever a victim of a crime or a disaster?: No Witnessed domestic violence?: No Has patient been effected by domestic violence as an adult?: No  Education:  Highest grade of school patient has completed: 12 Currently a student?: No Learning disability?: No  Employment/Work Situation:   Employment situation: Unemployed Patient's job has been impacted by current illness: Yes Describe how patient's job has been impacted: Patient lost his job 8/15 due to drinking on the job after [facility] and job was held for him during treatment What is the longest time patient has a held a job?: [employer]; on and off since 1998 Has patient ever been in the [employer] and Companymilitary?: No Has patient ever served in Buyer, retailcombat?: No  Financial Resources:      Alcohol/Substance Abuse:   What has been your use of drugs/alcohol within the last 12 months?: 12 or more 20 oz [employer] beers (higher alcohol content as per pt report) Patient usually consumes 4 20 oz beers first thing upon waking If attempted suicide, did drugs/alcohol play a role in this?: No Alcohol/Substance Abuse Treatment Hx: Past Tx, Inpatient, Past Tx, Outpatient, Attends AA/NA, Substance abuse evaluation If yes, describe treatment: [facility], [facility] and other treatment programs dating back over last 10 years Has alcohol/substance abuse ever caused legal problems?: Yes (Three DUI's (1986, 1999 and 2002))  Social Support System:   Forensic psychologistatient's Community Support System: Poor Museum/gallery exhibitions officerDescribe Community Support System: Parents, I tried to call AA contacts but nobody answers Type of faith/religion: NA  Leisure/Recreation:   Leisure and Hobbies: "I start drinking upon waking at noon"  Strengths/Needs:   What things does the  patient do well?: Nothing In what areas does  patient struggle / problems for patient: Homelessness and unemployment  Discharge Plan:   Does patient have access to transportation?: No Plan for no access to transportation at discharge: Bus ticket Will patient be returning to same living situation after discharge?: Yes (Homeless) Currently receiving community mental health services: No If no, would patient like referral for services when discharged?: Yes (What county?) Medical sales representative([place]) Does patient have financial barriers related to discharge medications?: Yes Patient description of barriers related to discharge medications: No income nor insurance  Summary/Recommendations:   Summary and Recommendations (to be completed by the evaluator): Patient is 53 YO unemployed male admitted with diagnosis of Alcohol Abuse. Patient has been to multiple treatment centers over the last 10 years and reports last inpatient stay was [facility] in July of 2015.  Patient would benefit from crisis stabilization, medication evaluation, therapy groups for processing thoughts/feelings/experiences, psycho ed groups for increasing coping skills, and aftercare planning. Discharge Process and Patient Expectations information sheet signed by patient, witnessed by writer and inserted in patient's shadow chart.   Clide DalesHarrill, Jay Potts. 12/03/2014

## 2014-12-03 NOTE — Progress Notes (Signed)
Jay Potts is handling his detox well today. HE is quite pleasant and cooperative  And he states he is feeling " much better " today.   A HE takes his scheduled meds as ordered and  He attends is groups as planned. HE is engaged in his recovery and says " I gotta get through this"... He completes his morning assessment and on it he writes he denies SI and he rates his depression, hopelessness and anxiety " 0/ 4 / 0", respectively. He says he wants to try a nicotine patch to help him with his nicotine withdrawal.   R Safety is in place

## 2014-12-03 NOTE — [facility] Group Notes (Signed)
[facility] LCSW Group Therapy  12/03/2014 2:02 PM  Type of Therapy:  Group Therapy  Participation Level:  Active  Participation Quality:  Appropriate, Sharing and Supportive  Affect:  Appropriate  Cognitive:  Appropriate and Oriented  Insight:  Developing/Improving, Engaged and Supportive  Engagement in Therapy:  Developing/Improving, Engaged and Supportive  Modes of Intervention:  Discussion, Education, Exploration, Rapport Building and Support  Summary of Progress/Problems: Pt was engaged and listened to others share about self-sabotaging behaviors and supports.  Pt shared a personal example of self-sabotaging behaviors, as well as effective coping skills.   Jay Potts, Jay Potts 12/03/2014, 2:02 PM

## 2014-12-03 NOTE — Progress Notes (Signed)
Patient did attend the evening speaker AA meeting.  

## 2014-12-03 NOTE — [facility] Group Notes (Signed)
[facility] Group Notes:  (Nursing/MHT/Case Management/Adjunct)  Date:  12/03/2014  Time:  1515pm  Type of Therapy:  Therapeutic Ball Activity  Participation Level:  Active  Participation Quality:  Appropriate and Attentive  Affect:  Appropriate  Cognitive:  Alert and Appropriate  Insight:  Appropriate and Good  Engagement in Group:  Engaged  Modes of Intervention:  Activity  Summary of Progress/Problems: Patient attended group and participated in activity and answered activity questions appropriately.  Lendell CapriceGuthrie, Amor Packard A 12/03/2014, 3:55 PM

## 2014-12-03 NOTE — Progress Notes (Signed)
[facility]  12/03/2014 2:44 PM Jay ShipJohn Potts  MRN:  [id number]  Subjective: Jay RuizJohn reports that he is doing a little better. However, complains of some clamminess and some tremors. Says the tremors may be associated with the coffee he is drinking and also, having been without Cigarettes x 3 days. Says he does not want to take Nicotine patch because it may not work for him as he is known to smoke 8-12 cigarettes daily. He did change his mind about not taking the Nicotoin patch. Is willing to give it a try because it is worth giving a shot. He [facility] or [facility] for further treatments. Jay Potts denies feeling depressed. Rates his anxiety at #4. Says he is sleeping about 80% at night. Denies any SIHI, AVH.  Diagnosis:   DSM5: Schizophrenia Disorders:  NA Obsessive-Compulsive Disorders:  NA Trauma-Stressor Disorders:  NA Substance/Addictive Disorders:  Alcohol Related Disorder - Severe (303.90) Depressive Disorders:  NA Total Time spent with patient: 35 minutes  Axis I:  Alcohol dependence Axis II: Deferred Axis III:  Past Medical History  Diagnosis Date  . Alcohol abuse    Axis IV: other psychosocial or environmental problems and Alcoholism, chronic Axis V: 51-60 moderate symptoms  ADL's:  Intact  Sleep: Good  Appetite:  Good  Suicidal Ideation:  Plan:  Denies Intent:  Denies Means:  Denies Homicidal Ideation:  Plan:  Denies Intent:  Denies Means:  Denies AEB (as evidenced by):  Psychiatric Specialty Exam: Physical Exam  Review of Systems  Constitutional: Positive for malaise/fatigue.  HENT: Negative.   Eyes: Negative.   Respiratory: Negative.   Cardiovascular: Negative.   Gastrointestinal: Negative.   Genitourinary: Negative.   Musculoskeletal: Negative.   Skin: Negative.   Neurological: Positive for tremors and weakness.  Endo/Heme/Allergies: Negative.   Psychiatric/Behavioral: Positive for substance abuse (Alcoholism,  chronic). Negative for depression, suicidal ideas, hallucinations and memory loss. The patient is nervous/anxious (rates #4). The patient does not have insomnia.     Blood pressure 119/69, pulse 82, temperature 98.4 F (36.9 C), temperature source Oral, resp. rate 16, height 5' 7.32" (1.71 m), weight 69.174 kg (152 lb 8 oz), SpO2 100 %.Body mass index is 23.66 kg/(m^2).  General Appearance: Casual  Eye Contact::  Good  Speech:  Clear and Coherent  Volume:  Normal  Mood:  Euthymic  Affect:  Appropriate  Thought Process:  Coherent, Goal Directed, Intact and Logical  Orientation:  Full (Time, Place, and Person)  Thought Content:  WDL  Suicidal Thoughts:  No  Homicidal Thoughts:  No  Memory:  Immediate;   Good Recent;   Good Remote;   Good  Judgement:  Intact  Insight:  Present  Psychomotor Activity:  Tremor  Concentration:  Good  Recall:  Good  Fund of Knowledge:Fair  Language: Good  Akathisia:  No  Handed:  Right  AIMS (if indicated):     Assets:  Desire for Improvement Physical Health  Sleep:  Number of Hours: 6.75   Musculoskeletal: Strength & Muscle Tone: within normal limits Gait & Station: normal Patient leans: N/A  Current Medications: Current Facility-Administered Medications  Medication Dose Route Frequency Provider Last Rate Last Dose  . acetaminophen (TYLENOL) tablet 650 mg  650 mg Oral Q6H PRN Nanine MeansJamison Lord, NP      . alum & mag hydroxide-simeth (MAALOX/MYLANTA) 200-200-20 MG/5ML suspension 30 mL  30 mL Oral Q4H PRN Nanine MeansJamison Lord, NP      . LORazepam (ATIVAN)  tablet 0-4 mg  0-4 mg Oral Q12H Nanine MeansJamison Lord, NP      . LORazepam (ATIVAN) tablet 1 mg  1 mg Oral Q8H PRN Nanine MeansJamison Lord, NP   1 mg at 12/02/14 1205  . magnesium hydroxide (MILK OF MAGNESIA) suspension 30 mL  30 mL Oral Daily PRN Nanine MeansJamison Lord, NP      . nicotine (NICODERM CQ - dosed in mg/24 hours) patch 21 mg  21 mg Transdermal Daily Nanine MeansJamison Lord, NP   21 mg at 12/02/14 0800  . ondansetron (ZOFRAN) tablet 4 mg   4 mg Oral Q8H PRN Nanine MeansJamison Lord, NP      . thiamine (VITAMIN B-1) tablet 100 mg  100 mg Oral Daily Nanine MeansJamison Lord, NP   100 mg at 12/03/14 16100846   Or  . thiamine (B-1) injection 100 mg  100 mg Intravenous Daily Nanine MeansJamison Lord, NP      . zolpidem (AMBIEN) tablet 5 mg  5 mg Oral QHS PRN Nanine MeansJamison Lord, NP   5 mg at 12/02/14 2130    Lab Results: No results found for this or any previous visit (from the past 48 hour(s)).  Physical Findings: AIMS: Facial and Oral Movements Muscles of Facial Expression: None, normal Lips and Perioral Area: None, normal Jaw: None, normal Tongue: None, normal,Extremity Movements Upper (arms, wrists, hands, fingers): None, normal Lower (legs, knees, ankles, toes): None, normal, Trunk Movements Neck, shoulders, hips: None, normal, Overall Severity Severity of abnormal movements (highest score from questions above): None, normal Incapacitation due to abnormal movements: None, normal Patient's awareness of abnormal movements (rate only patient's report): No Awareness, Dental Status Current problems with teeth and/or dentures?: No Does patient usually wear dentures?: No  CIWA:  CIWA-Ar Total: 3 COWS:     Treatment Plan Summary: Daily contact with patient to assess and evaluate symptoms and progress in treatment Medication management  Plan: 1. Continue crisis management, mood stabilization & relapse prevention.. 2. Continue current medication management to reduce current symptoms to base line and improve the  patient's overall level of functioning; Continue ativan detox protocols for alcohol detox, Zolpidem 5 mg for insomnia. Will try the Nicotine patch for nicotine addiction. 3. Treat health problems as indicated. 4. Develop treatment plan to enhance medication adeherance upon discharge and the need for  readmission. 5. Psycho-social education regarding relapse prevention and self care.   Medical Decision Making Problem Points:  Established problem,  stable/improving (1), Review of last therapy session (1) and Review of psycho-social stressors (1) Data Points:  Review of medication regiment & side effects (2) Review of new medications or change in dosage (2)  I certify that inpatient services furnished can reasonably be expected to improve the patient's condition.   Armandina Stammerwoko, Agnes I, PMHNp-BC 12/03/2014, 2:44 PM I agree with assessment and plan Madie Renorving A. Dub MikesLugo, M.D.

## 2014-12-04 DIAGNOSIS — F1994 Other psychoactive substance use, unspecified with psychoactive substance-induced mood disorder: Secondary | ICD-10-CM

## 2014-12-04 MED ORDER — ZOLPIDEM TARTRATE 5 MG PO TABS: 5.0000 mg | ORAL_TABLET | Freq: Every evening | ORAL | Status: DC | PRN

## 2014-12-04 NOTE — Progress Notes (Signed)
D: Pt presents anxious this morning. Pt fidgety and animated on approach. Pt denies suicidal thoughts. Pt denies experiencing any withdrawal symptoms this morning. Pt rates depression 0/10. Anxiety 2/10. Hopeless 0/10. Pt reports improving sleep and appetite. Pt has improved hygiene. Pt behavior is appropriate to situation. Pt compliant with taking meds and attending groups. No adverse reactions to meds verbalized by pt at this time.  A: Medications administered as ordered per MD. Verbal support given. Pt encouraged to attend groups. 15 minute checks performed for safety.  R: Pt requesting to be transferred to a long-term treatment facility such as [facility] or [facility]. Pt receptive to treatment.

## 2014-12-04 NOTE — [facility] Group Notes (Signed)
   [facility]  12/04/2014  8:45 AM   Participation Quality: Alert, Appropriate and Oriented  Mood/Affect: Appropriate   Depression Rating: 0  Anxiety Rating: 3  Thoughts of Suicide: Pt denies SI/HI  Will you contract for safety? Yes  Current AVH: Pt denies  Plan for Discharge/Comments: Pt attended discharge planning group and actively participated in group. CSW provided pt with today's workbook. Patient reports feeling better today than on admission. He reports being hospitalized due to ETOH use and is requesting residential treatment at this time.  Transportation Means: Continuing to assess  Supports: No supports mentioned at this time  Samuella BruinKristin Jovonta Levit, MSW, [employer]CSWA Clinical Social Worker [facility] [phone]

## 2014-12-04 NOTE — Progress Notes (Signed)
D. Pt has been up and has been active in milieu this evening, did attend and participate in evening group activity. Pt reports feeling better today and minimal signs or symptoms of withdrawal present this evening. Pt does report that he is waiting to get into [facility] or [facility] from here and received medication without incident. A. Support and encouragement provided. R. Safety maintained, will continue to monitor.

## 2014-12-04 NOTE — Progress Notes (Signed)
[facility]  12/04/2014 6:21 PM Jay ShipJohn Potts  MRN:  [id number] Subjective:  Jay RuizJohn states he is feeling better. He would like to be admitted to a residential treatment center as he is concerned about relapsing.  Diagnosis:   DSM5:  Substance/Addictive Disorders:  Alcohol Related Disorder - Severe (303.90)  Total Time spent with patient: 20 minutes  Axis I: Substance Induced Mood Disorder  ADL's:  Intact  Sleep: Fair  Appetite:  Fair    Psychiatric Specialty Exam: Physical Exam  Review of Systems  Constitutional: Negative.   HENT: Negative.   Eyes: Negative.   Respiratory: Negative.   Cardiovascular: Negative.   Gastrointestinal: Negative.   Genitourinary: Negative.   Musculoskeletal: Negative.   Skin: Negative.   Neurological: Negative.   Endo/Heme/Allergies: Negative.   Psychiatric/Behavioral: Positive for substance abuse. The patient is nervous/anxious.     Blood pressure 119/84, pulse 88, temperature 97.5 F (36.4 C), temperature source Oral, resp. rate 24, height 5' 7.32" (1.71 m), weight 69.174 kg (152 lb 8 oz), SpO2 100 %.Body mass index is 23.66 kg/(m^2).  General Appearance: Fairly Groomed  Patent attorneyye Contact::  Fair  Speech:  Clear and Coherent  Volume:  Normal  Mood:  Euthymic  Affect:  Appropriate  Thought Process:  Coherent and Goal Directed  Orientation:  Full (Time, Place, and Person)  Thought Content:  plans as he moves on, relapse prevention plan  Suicidal Thoughts:  No  Homicidal Thoughts:  No  Memory:  Immediate;   Fair Recent;   Fair Remote;   Fair  Judgement:  Fair  Insight:  Present  Psychomotor Activity:  Normal  Concentration:  Fair  Recall:  [employer]Fair  Fund of Knowledge:Fair  Language: Fair  Akathisia:  No  Handed:    AIMS (if indicated):     Assets:  Desire for Improvement  Sleep:  Number of Hours: 5.5   Musculoskeletal: Strength & Muscle Tone: within normal limits Gait & Station: normal Patient leans: N/A  Current  Medications: Current Facility-Administered Medications  Medication Dose Route Frequency Provider Last Rate Last Dose  . acetaminophen (TYLENOL) tablet 650 mg  650 mg Oral Q6H PRN Nanine MeansJamison Lord, NP   650 mg at 12/03/14 1719  . alum & mag hydroxide-simeth (MAALOX/MYLANTA) 200-200-20 MG/5ML suspension 30 mL  30 mL Oral Q4H PRN Nanine MeansJamison Lord, NP      . LORazepam (ATIVAN) tablet 0-4 mg  0-4 mg Oral Q12H Nanine MeansJamison Lord, NP   1 mg at 12/04/14 0813  . LORazepam (ATIVAN) tablet 1 mg  1 mg Oral Q8H PRN Nanine MeansJamison Lord, NP   1 mg at 12/03/14 1720  . magnesium hydroxide (MILK OF MAGNESIA) suspension 30 mL  30 mL Oral Daily PRN Nanine MeansJamison Lord, NP      . nicotine (NICODERM CQ - dosed in mg/24 hours) patch 21 mg  21 mg Transdermal Daily Sanjuana KavaAgnes I Nwoko, NP   21 mg at 12/04/14 0815  . ondansetron (ZOFRAN) tablet 4 mg  4 mg Oral Q8H PRN Nanine MeansJamison Lord, NP      . thiamine (VITAMIN B-1) tablet 100 mg  100 mg Oral Daily Nanine MeansJamison Lord, NP   100 mg at 12/04/14 [id number]   Or  . thiamine (B-1) injection 100 mg  100 mg Intravenous Daily Nanine MeansJamison Lord, NP      . zolpidem (AMBIEN) tablet 5 mg  5 mg Oral QHS PRN Nanine MeansJamison Lord, NP   5 mg at 12/03/14 2157    Lab Results: No results found for this  or any previous visit (from the past 48 hour(s)).  Physical Findings: AIMS: Facial and Oral Movements Muscles of Facial Expression: None, normal Lips and Perioral Area: None, normal Jaw: None, normal Tongue: None, normal,Extremity Movements Upper (arms, wrists, hands, fingers): None, normal Lower (legs, knees, ankles, toes): None, normal, Trunk Movements Neck, shoulders, hips: None, normal, Overall Severity Severity of abnormal movements (highest score from questions above): None, normal Incapacitation due to abnormal movements: None, normal Patient's awareness of abnormal movements (rate only patient's report): No Awareness, Dental Status Current problems with teeth and/or dentures?: No Does patient usually wear dentures?: No  CIWA:   CIWA-Ar Total: 3 COWS:     Treatment Plan Summary: Daily contact with patient to assess and evaluate symptoms and progress in treatment Medication management  Plan: Supportive approach/coping skills/relapse prevention           Complete the detox            Continue to reassess and address the co morbidities           Explore possible placement options for a residential treatment program Medical Decision Making Problem Points:  Review of psycho-social stressors (1) Data Points:  Review of medication regiment & side effects (2)  I certify that inpatient services furnished can reasonably be expected to improve the patient's condition.   Britlyn Martine A 12/04/2014, 6:21 PM

## 2014-12-04 NOTE — Clinical Social Work Note (Signed)
CSW informed by RN that patient is taking Ambien. Patient not able to discharge to residential treatment facility taking Ambien. [facility] screening that was scheduled for 12/22 has been cancelled. Patient informed, is requesting [facility].  Samuella BruinKristin Swara Donze, MSW, [employer]LCSWA Clinical Social Worker [facility] [phone]

## 2014-12-04 NOTE — Clinical Social Work Note (Addendum)
CSW initiated referral to [facility], awaiting return call from Admissions Coordinator Trey PaulaJeff.  CSW contacted [facility], per staff no male beds available until mid-January.  Samuella BruinKristin Raquelle Pietro, MSW, [employer]CSWA Clinical Social Worker [facility] [phone]

## 2014-12-04 NOTE — [facility] Group Notes (Signed)
[facility] LCSW Group Therapy 12/04/2014  1:15 PM   Type of Therapy: Group Therapy  Participation Level: Did Not Attend for unknown reason.   Samuella BruinKristin Aashna Matson, MSW, [employer]LCSWA Clinical Social Worker [facility] [phone]

## 2014-12-05 DIAGNOSIS — F10230 Alcohol dependence with withdrawal, uncomplicated: Secondary | ICD-10-CM

## 2014-12-05 NOTE — Progress Notes (Signed)
[facility] Discharge [facility] :  Will you be returning to the same living situation after discharge: No. [facility] At discharge, do you have transportation home?:Yes,  Patient will be provided with [facility] Do you have the ability to pay for your medications:Yes,  patient will be provided with medication samples and prescriptions at discharge  Release of information consent forms completed and in the chart;  Patient's signature needed at discharge.  Patient to Follow up at: Follow-up Information    [facility]2014.   Why:  Assessment for therapy and medication management services on Tuesday January 5th at 1 pm. Please bring ID, proof of income, and letter from shelter verifying your residence. Please call office if you need to reschedule.   Contact information:   [address] [place], [place] [address] [phone] Fax: [phone]      Patient denies SI/HI:   Yes,  denies    Safety Planning and Suicide Prevention discussed:  Yes,  with patient and mother   Johny ChessDrinkard, Leilanny Fluitt L 12/05/2014, 2:35 PM

## 2014-12-05 NOTE — Progress Notes (Signed)
D. Pt has been up and has been active in the milieu this evening, attended and participated in evening group activity. Pt is pleasant and appropriate with interaction and reports no ill effects associated with withdrawal and does not exhibit any overt signs or symptoms of withdrawal. Pt is aware that his original discharge plan needs to be adjusted to address the issue of his using Ambien. A. Support and encouragement provided. R. Safety maintained, will continue to monitor.

## 2014-12-05 NOTE — Discharge Summary (Signed)
Physician Discharge Summary Note  Patient:  Jay Potts is an 53 y.o., male MRN:  [id number] DOB:  1961-05-06 Patient phone:  [phone] (home)  Patient address:   [address]  [place] [address],  Total Time spent with patient: Greater than 30 minutes  Date of Admission:  12/01/2014 Date of Discharge: 12/05/14  Reason for Admission: Alcohol intoxication needing detox treatment  Discharge Diagnoses: Active Problems:   Alcohol dependence with uncomplicated withdrawal   Psychiatric Specialty Exam: Physical Exam  Psychiatric: His speech is normal and behavior is normal. Judgment and thought content normal. His mood appears not anxious. His affect is not angry, not blunt, not labile and not inappropriate. Cognition and memory are normal. He does not exhibit a depressed mood.    Review of Systems  Constitutional: Negative.   HENT: Negative.   Eyes: Negative.   Respiratory: Negative.   Cardiovascular: Negative.   Genitourinary: Negative.   Musculoskeletal: Negative.   Skin: Negative.   Endo/Heme/Allergies: Negative.   Psychiatric/Behavioral: Positive for substance abuse (Alcoholism, chronic). Negative for depression (Stable), suicidal ideas, hallucinations and memory loss. The patient has insomnia (Stable). The patient is not nervous/anxious.   All other systems reviewed and are negative.   Blood pressure 113/74, pulse 105, temperature 97.7 F (36.5 C), temperature source Oral, resp. rate 20, height 5' 7.32" (1.71 m), weight 69.174 kg (152 lb 8 oz), SpO2 100 %.Body mass index is 23.66 kg/(m^2).  See Md's SRA                                                 Past Psychiatric History: Diagnosis:Alcohol dependence  Hospitalizations:None reported  Outpatient Care: None  Substance Abuse Care: [facility], [facility]  Self-Mutilation: Denies  Suicidal Attempts: Denies  Violent Behaviors: Denies   Musculoskeletal: Strength & Muscle Tone: within  normal limits Gait & Station: normal Patient leans: N/A  DSM5: Schizophrenia Disorders:  NA Obsessive-Compulsive Disorders:  NA Trauma-Stressor Disorders:  NA Substance/Addictive Disorders:  Alcohol Related Disorder - Severe (303.90) Depressive Disorders:  NA  Axis Diagnosis:  AXIS I:  Alcohol dependence AXIS II:  Deferred AXIS III:   Past Medical History  Diagnosis Date  . Alcohol abuse    AXIS IV:  Alcoholism, chronic AXIS V:  63  Level of Care:  [facility]  Hospital Course:  Caucasian male, 53 years old was evaluated this am for Alcohol dependence issue. Patient started drinking off and on at age 53. [facility] but relapsed and started drinking Two weeks after his release. Patient reports drinking 12-14 Beers daily. Patient stated that he tried using Cocaine once in his life last week with friends and states that he will not use it again. He uses Marijuana once a month only when drinking with his friends. Patient denies Alcohol withdrawal seizures but stated that he drinks until he falls asleep at times.  Jay Potts was admitted to the hospital for alcohol dependency issues. He received alcohol detoxification treatments with the Ativan detox regimen on a tapering dose format. He was also medicated with Ambien 5 mg Q bedtime for insomnia. He presented no other serious health issues that required treatment and or monitoring. He tolerated his treatment regimen without any significant adverse effects and reactions reported.  Jay Potts  has completed detox treatment and his mood is stable. This is evidenced by his reports of improved mood and absence of substance withdrawal symptoms. He is currently being discharged to the [facility] in [place], [place] and will follow-up care at the [facility]. He is provided with all the necessary information required to make this appointment  without problems. Upon discharge, he adamantly denies any SIHI, AVH, delusional thoughts, paranoia and or withdrawal symptoms. Brewer did not go home on any medication/prescriptions. He left [facility] in no apparent distress. Transportation per train. [facility] provided train ticket.   Consults:  psychiatry  Significant Diagnostic Studies:  labs: CBC with diff, CMP, UDS, toxicology tests, U/A  Discharge Vitals:   Blood pressure 113/74, pulse 105, temperature 97.7 F (36.5 C), temperature source Oral, resp. rate 20, height 5' 7.32" (1.71 m), weight 69.174 kg (152 lb 8 oz), SpO2 100 %. Body mass index is 23.66 kg/(m^2). Lab Results:   No results found for this or any previous visit (from the past 72 hour(s)).  Physical Findings: AIMS: Facial and Oral Movements Muscles of Facial Expression: None, normal Lips and Perioral Area: None, normal Jaw: None, normal Tongue: None, normal,Extremity Movements Upper (arms, wrists, hands, fingers): None, normal Lower (legs, knees, ankles, toes): None, normal, Trunk Movements Neck, shoulders, hips: None, normal, Overall Severity Severity of abnormal movements (highest score from questions above): None, normal Incapacitation due to abnormal movements: None, normal Patient's awareness of abnormal movements (rate only patient's report): No Awareness, Dental Status Current problems with teeth and/or dentures?: No Does patient usually wear dentures?: No  CIWA:  CIWA-Ar Total: 3 COWS:     Psychiatric Specialty Exam: See Psychiatric Specialty Exam and Suicide Risk Assessment completed by Attending Physician prior to discharge.  Discharge destination:  [facility]  Is patient on multiple antipsychotic therapies at discharge:  No   Has Patient had three or more failed trials of antipsychotic monotherapy by history:  No  Recommended Plan for Multiple Antipsychotic Therapies: NA    Medication List    STOP taking these medications        ALEVE 220 MG tablet  Generic  drug:  naproxen sodium     aspirin 325 MG tablet     diphenhydramine-acetaminophen 25-500 MG Tabs  Commonly known as:  TYLENOL PM     multivitamin-iron-minerals-folic acid chewable tablet       Follow-up Information    [facility]2014.   Why:  Assessment for therapy and medication management services on Tuesday January 5th at 1 pm. Please bring ID, proof of income, and letter from shelter verifying your residence. Please call office if you need to reschedule.   Contact information:   [address], [address] [place], [place] [address] [phone] Fax: [phone]    Follow-up recommendations: Activity:  As tolerated Diet: As recommended by your primary care doctor. Keep all scheduled follow-up appointments as recommended.   Comments: Take all your medications as prescribed by your mental healthcare provider. Report any adverse effects and or reactions from your medicines to your outpatient provider promptly. Patient is instructed and cautioned to not engage in alcohol and or illegal drug use while on prescription medicines. In the event of worsening symptoms, patient is instructed to call the crisis hotline, 911 and or go to the nearest ED for appropriate evaluation and treatment of symptoms. Follow-up with your primary care provider for your other medical issues, concerns and or health care needs.   Total  Discharge Time:  Greater than 30 minutes.  Signed: Sanjuana Kavawoko, Agnes I, PMHNP-BC 12/05/2014, 3:11 PM  I personally assessed the patient and formulated the plan Madie RenoIrving A. Dub MikesLugo, M.D.

## 2014-12-05 NOTE — Clinical Social Work Note (Signed)
CSW attempted to speak with patient's mother mother Brent GeneralCylpha Spicher [phone], she was unavailable at this time.   Samuella BruinKristin Izen Petz, MSW, [employer]CSWA Clinical Social Worker [facility] [phone]

## 2014-12-05 NOTE — [facility] Suicide Risk Assessment (Signed)
Suicide Risk Assessment  Discharge Assessment     Demographic Factors:  Male  Total Time spent with patient: 30 minutes  Psychiatric Specialty Exam:     Blood pressure 113/74, pulse 105, temperature 97.7 F (36.5 C), temperature source Oral, resp. rate 20, height 5' 7.32" (1.71 m), weight 69.174 kg (152 lb 8 oz), SpO2 100 %.Body mass index is 23.66 kg/(m^2).  General Appearance: Fairly Groomed  Patent attorneyye Contact::  Fair  Speech:  Clear and Coherent  Volume:  Normal  Mood:  Euthymic  Affect:  Appropriate  Thought Process:  Coherent and Goal Directed  Orientation:  Full (Time, Place, and Person)  Thought Content:  plans as he moves on, relapse prevention plan  Suicidal Thoughts:  No  Homicidal Thoughts:  No  Memory:  Immediate;   Fair Recent;   Fair Remote;   Fair  Judgement:  Fair  Insight:  Present  Psychomotor Activity:  Normal  Concentration:  Fair  Recall:  FiservFair  Fund of Knowledge:Fair  Language: Fair  Akathisia:  No  Handed:    AIMS (if indicated):     Assets:  Desire for Improvement  Sleep:  Number of Hours: 6.5    Musculoskeletal: Strength & Muscle Tone: within normal limits Gait & Station: normal Patient leans: N/A   Mental Status Per Nursing Assessment::   On Admission:     Current Mental Status by Physician: In full contact with reality. There are no active S/S of withdrawal. There are no active SI plans or intent. [facility]. States he can make things work out from there. He is interested in pursuing further schooling in ministry. There are is a school in that area where he was going to go for that purpose but due tho his active drinking he could not pursue it. Now he sees going to the [facility] as another opportunity of achieving his goal.   Loss Factors: NA  Historical Factors: NA  Risk Reduction Factors:   Has personal goals including getting more schooling and working in [employer]ministry  Continued Clinical Symptoms:   Alcohol/Substance Abuse/Dependencies  Cognitive Features That Contribute To Risk:  Closed-mindedness Polarized thinking Thought constriction (tunnel vision)    Suicide Risk:  Minimal: No identifiable suicidal ideation.  Patients presenting with no risk factors but with morbid ruminations; may be classified as minimal risk based on the severity of the depressive symptoms  Discharge Diagnoses:   AXIS I:  Alcohol Dependence S/P withdrawal AXIS II:  No diagnosis AXIS III:   Past Medical History  Diagnosis Date  . Alcohol abuse    AXIS IV:  other psychosocial or environmental problems AXIS V:  61-70 mild symptoms  Plan Of Care/Follow-up recommendations:  Activity:  as tolerated  Diet:  regular Follow up outpatient care/[facility] Is patient on multiple antipsychotic therapies at discharge:  No   Has Patient had three or more failed trials of antipsychotic monotherapy by history:  No  Recommended Plan for Multiple Antipsychotic Therapies: NA    Jay Potts A 12/05/2014, 11:55 AM

## 2014-12-05 NOTE — Clinical Social Work Note (Signed)
CSW attempted to speak with patient's mother mother Cylpha Crespi [phone], she was unavailable at this time.   Mikell Camp, MSW, LCSWA Clinical Social Worker [facility] [phone]  

## 2014-12-05 NOTE — Progress Notes (Signed)
Pt attended spiritual care group on grief and loss facilitated by chaplain Burnis KingfisherMatthew Arella Blinder and counseling intern [place] Austin. Group opened with brief discussion and psycho-social ed around grief and loss in relationships and in relation to self - identifying life patterns, circumstances, changes that cause losses. Established group norm of speaking from own life experience. Group goal of establishing open and affirming space for members to share loss and experience with grief, normalize grief experience and provide psycho social education and grief support.  Group drew on narrative and Alderian therapeutic modalities.   Yousaf was present for group introduction, but left group without contributing to the discussion. Lynford did not return to group.   [place]Jordan Austin Counseling Intern

## 2014-12-05 NOTE — Progress Notes (Signed)
Adult Psychoeducational Group Note  Date:  12/05/2014 Time:  9:45 AM  Group Topic/Focus:  Recovery Goals:   The focus of this group is to identify appropriate goals for recovery and establish a plan to achieve them.  Participation Level:  Active  Participation Quality:  Appropriate  Affect:  Appropriate  Cognitive:  Alert and Appropriate  Insight: Appropriate  Engagement in Group:  Engaged  Modes of Intervention:  Discussion  Additional Comments:  Pt attended group this morning. Today's topic for group was recovery. Pt participate in group with peers.   Yoshi Mancillas A

## 2014-12-05 NOTE — Clinical Social Work Note (Signed)
Train ticket, directions, and $2 bus fare placed reviewed with patient and placed on patient's chart. RN updated.  Samuella BruinKristin Tammy Wickliffe, MSW, [employer]CSWA Clinical Social Worker [facility] [phone]

## 2014-12-05 NOTE — [facility] Suicide Risk Assessment (Signed)
[facility] INPATIENT:  Family/Significant Other Suicide Prevention Education  Suicide Prevention Education:  Education Completed; mother Benjiman CoreZylpha Nash [phone],  (name of family member/significant other) has been identified by the patient as the family member/significant other with whom the patient will be residing, and identified as the person(s) who will aid the patient in the event of a mental health crisis (suicidal ideations/suicide attempt).  With written consent from the patient, the family member/significant other has been provided the following suicide prevention education, prior to the and/or following the discharge of the patient.  The suicide prevention education provided includes the following:  Suicide risk factors  Suicide prevention and interventions  National Suicide Hotline telephone number  [facility] assessment telephone number  [facility] Emergency Assistance 911  [facility] and/or Residential Mobile Crisis Unit telephone number  Request made of family/significant other to:  Remove weapons (e.g., guns, rifles, knives), all items previously/currently identified as safety concern.    Remove drugs/medications (over-the-counter, prescriptions, illicit drugs), all items previously/currently identified as a safety concern.  The family member/significant other verbalizes understanding of the suicide prevention education information provided.  The family member/significant other agrees to remove the items of safety concern listed above.  Keyly Baldonado, West CarboKristin L 12/05/2014, 2:28 PM

## 2014-12-05 NOTE — Progress Notes (Signed)
Discharge note: Pt received both written and verbal discharge instructions. Pt verbalized understanding of discharge instructions. Pt denies SI/HI, depression and withdrawal at time of discharge.  Pt agreed to f/u appt. Pt received discharge packet, train ticket, bus fare and belongings. Pt safely left [facility].

## 2014-12-05 NOTE — Tx Team (Signed)
Interdisciplinary Treatment Plan Update (Adult) Date: 12/05/2014   Time Reviewed: 9:30 AM  Progress in Treatment: Attending groups: Yes Participating in groups: Yes Taking medication as prescribed: Yes Tolerating medication: Yes Family/Significant other contact made: No, CSW continuing to assess for collateral contacts Patient understands diagnosis: Yes Discussing patient identified problems/goals with staff: Yes Medical problems stabilized or resolved: Yes Denies suicidal/homicidal ideation: Yes Issues/concerns per patient self-inventory: Yes Other:  New problem(s) identified: N/A  Discharge Plan or Barriers: [place] and requesting residential treatment. Patient has been to multiple facilities in the past. He is not [facility], or [facility] at this time due to being on Ambien. [facility] and is requesting assistance with transportation.  Reason for Continuation of Hospitalization:  Depression Anxiety Medication Stabilization   Comments: N/A  Estimated length of stay: Discharge anticipated for today 12/05/14.  For review of initial/current patient goals, please see plan of care. Patient is 53 YO unemployed male admitted with diagnosis of Alcohol Abuse. Patient has been to multiple treatment centers over the last 10 years and reports last inpatient stay was [facility] in July of 2015. Patient would benefit from crisis stabilization, medication evaluation, therapy groups for processing thoughts/feelings/experiences, psycho ed groups for increasing coping skills, and aftercare planning. Discharge Process and Patient Expectations information sheet signed by patient, witnessed by writer and inserted in patient's shadow chart.   Attendees: Patient:    Family:    Physician: Dr. Jama Flavorsobos; Dr. Dub MikesLugo 12/05/2014 9:30 AM  Nursing: Harold Barbanonecia Byrd; CasaPatrice White, RN 12/05/2014  9:30 AM  Clinical Social Worker: Samuella BruinKristin Adysen Raphael,  LCSWA 12/05/2014 9:30 AM  Other: Juline PatchQuylle Hodnett, LCSW 12/05/2014 9:30 AM  Other: Leisa LenzValerie Enoch, [facility] Liaison 12/05/2014 9:30 AM  Other: Onnie BoerJennifer Clark, Case Manager 12/05/2014 9:30 AM  Other: Serena ColonelAggie Nwoko, NP 12/05/2014 9:30 AM  Other:    Other:    Other:    Other:    Other:      Scribe for Treatment Team:  Samuella BruinKristin Arshiya Jakes, MSW, [employer]CSWA [phone]

## 2014-12-06 NOTE — Progress Notes (Signed)
Patient Discharge Instructions:  After Visit Summary (AVS):   Faxed to:  12/06/14 Discharge Summary Note:   Faxed to:  12/06/14 Psychiatric Admission Assessment Note:   Faxed to:  12/06/14 Suicide Risk Assessment - Discharge Assessment:   Faxed to:  12/06/14 Faxed/Sent to the Next Level Care provider:  12/06/14 Faxed to [facility] @ [phone]  Jerelene ReddenSheena E Woodbine, 12/06/2014, 3:35 PM

## 2014-12-27 ENCOUNTER — Emergency Department (HOSPITAL_COMMUNITY)
Admission: EM | Admit: 2014-12-27 | Discharge: 2014-12-27 | Disposition: A | Discharge status: Home / Self Care | Payer: Self-pay | Attending: Emergency Medicine | Admitting: Emergency Medicine

## 2014-12-27 ENCOUNTER — Encounter (HOSPITAL_COMMUNITY): Payer: Self-pay | Admitting: Emergency Medicine

## 2014-12-27 ENCOUNTER — Emergency Department (HOSPITAL_COMMUNITY): Payer: Self-pay

## 2014-12-27 DIAGNOSIS — S92512A Displaced fracture of proximal phalanx of left lesser toe(s), initial encounter for closed fracture: Secondary | ICD-10-CM | POA: Insufficient documentation

## 2014-12-27 DIAGNOSIS — Y9289 Other specified places as the place of occurrence of the external cause: Secondary | ICD-10-CM | POA: Insufficient documentation

## 2014-12-27 DIAGNOSIS — Y998 Other external cause status: Secondary | ICD-10-CM | POA: Insufficient documentation

## 2014-12-27 DIAGNOSIS — Z720 Tobacco use: Secondary | ICD-10-CM | POA: Insufficient documentation

## 2014-12-27 DIAGNOSIS — S92501A Displaced unspecified fracture of right lesser toe(s), initial encounter for closed fracture: Secondary | ICD-10-CM

## 2014-12-27 DIAGNOSIS — Y9389 Activity, other specified: Secondary | ICD-10-CM | POA: Insufficient documentation

## 2014-12-27 DIAGNOSIS — W2203XA Walked into furniture, initial encounter: Secondary | ICD-10-CM | POA: Insufficient documentation

## 2014-12-27 MED ORDER — HYDROCODONE-ACETAMINOPHEN 5-325 MG PO TABS: 1.0000 | ORAL_TABLET | Freq: Four times a day (QID) | ORAL | Status: DC | PRN

## 2014-12-27 MED ORDER — HYDROCODONE-ACETAMINOPHEN 5-325 MG PO TABS
2.0000 | ORAL_TABLET | Freq: Once | ORAL | Status: AC
  Administered 2014-12-27: 2 via ORAL
  Filled 2014-12-27: qty 2

## 2014-12-27 NOTE — ED Provider Notes (Signed)
CSN: [id number]     Arrival date & time 12/27/14  1319 History  This chart was scribed for non-physician practitioner Jinny Sanders, PA-C, working with Samuel Jester, DO by Littie Deeds, ED Scribe. This patient was seen in room WTR9/WTR9 and the patient's care was started at 1:51 PM.     Chief Complaint  Patient presents with  . Toe Pain   The history is provided by the patient. No language interpreter was used.   HPI Comments: Jay Potts is a 54 y.o. male who presents to the Emergency Department complaining of sudden onset, constant left foot pain that started 3 nights ago. Patient believes he may have fractured some of his toes. He states he may have hit a piece of furniture in his sleep as he sometimes does martial arts in sleep sometimes. Patient reports having some numbness in his toes and has had some difficulty with ambulation. Patient notes a similar episode when he kicked a hole in the wall last summer in his sleep, but he did not fracture anything at that time. He practices a variance of kung fu and small circle jujitsu. Patient did not drive here today; he rode the bus. NKDA.  Past Medical History  Diagnosis Date  . Alcohol abuse    Past Surgical History  Procedure Laterality Date  . Tonsillectomy     No family history on file. History  Substance Use Topics  . Smoking status: Current Every Day Smoker -- 0.50 packs/day  . Smokeless tobacco: Not on file  . Alcohol Use: Yes     Comment: ETOH abuse x30 years, here for med clearance Dec 2015    Review of Systems  Skin: Positive for wound.  Neurological: Positive for numbness.      Allergies  Prednisone  Home Medications   Prior to Admission medications   Medication Sig Start Date End Date Taking? Authorizing Provider  HYDROcodone-acetaminophen (NORCO/VICODIN) 5-325 MG per tablet Take 1-2 tablets by mouth every 6 (six) hours as needed for moderate pain or severe pain. 12/27/14   Monte Fantasia, PA-C   BP 155/84 mmHg   Pulse 103  Temp(Src) 98.5 F (36.9 C) (Oral)  Resp 20  SpO2 98% Physical Exam  Constitutional: He is oriented to person, place, and time. He appears well-developed and well-nourished. No distress.  HENT:  Head: Normocephalic and atraumatic.  Mouth/Throat: Oropharynx is clear and moist. No oropharyngeal exudate.  Eyes: Pupils are equal, round, and reactive to light.  Neck: Neck supple.  Cardiovascular: Normal rate.   Pulses:      Dorsalis pedis pulses are 2+ on the left side.  Pulmonary/Chest: Effort normal.  Musculoskeletal: He exhibits no edema.  Motor strength 5 out of 5 at knee, ankle, toes. Toes showing ecchymosis interphalangeal between digits one through 5. Patient has moderate amount of pain to palpation of phalanges 2 through 5. No obvious deformity noted. No obvious erythema, edema. DP pulse 2+. Capillary refill less than 2 seconds. Distal sensation intact.  Neurological: He is alert and oriented to person, place, and time. No cranial nerve deficit.  Skin: Skin is warm and dry. No rash noted.  Moderate amount of ecchymosis, interphalangeal diffusely between toes 1-5. Mild swelling on the small toe.  Psychiatric: He has a normal mood and affect. His behavior is normal.  Nursing note and vitals reviewed.   ED Course  Procedures  DIAGNOSTIC STUDIES: Oxygen Saturation is 98% on room air, normal by my interpretation.    COORDINATION OF CARE: 1:56  PM-Discussed treatment plan which includes imaging and pain medications with pt at bedside and pt agreed to plan.    Labs Review Labs Reviewed - No data to display  Imaging Review Dg Foot Complete Left  12/27/2014   CLINICAL DATA:  Sudden onset of constant left foot pain which be an 3 nights ago. Patient bleed C may have fractured some of is toes.  EXAM: LEFT FOOT - COMPLETE 3+ VIEW  COMPARISON:  None.  FINDINGS: There is a nondisplaced fracture deformity involving the fifth proximal phalanx. Mild lateral and plantar angulation is  identified. No additional fractures or subluxations identified.  IMPRESSION: 1. Acute fracture involves the fifth proximal phalanx.   Electronically Signed   By: Signa Kellaylor  Stroud M.D.   On: 12/27/2014 14:38     EKG Interpretation None      MDM   Final diagnoses:  Fracture of fifth toe, right, closed, initial encounter    Patient here with toe pain after kicking his bed during the night area and patient neurovascularly intact. X-rays with impression of the acute fracture involving the fifth proximal phalanx with mild lateral and plantar angulation identified. Patient's toes buddy taped, patient placed in postop shoe. Patient strongly encouraged to follow-up with orthopedics. I discussed return precautions with patient, and patient verbalizes understanding and agreement of this plan. I encouraged patient to call or return to ER should he have any questions or concerns.  I personally performed the services described in this documentation, which was scribed in my presence. The recorded information has been reviewed and is accurate.  BP 155/84 mmHg  Pulse 103  Temp(Src) 98.5 F (36.9 C) (Oral)  Resp 20  SpO2 98%  Signed,  Ladona MowJoe Tedra Coppernoll, PA-C 10:18 PM  Patient seen and discussed with Dr. Samuel JesterKathleen McManus, M.D.   Monte FantasiaJoseph W Yanky Vanderburg, PA-C 12/27/14 2218  Samuel JesterKathleen McManus, DO 12/30/14 [phone]

## 2014-12-27 NOTE — Discharge Instructions (Signed)

## 2014-12-27 NOTE — ED Notes (Signed)
Pt here stating he mat have fractured his toes on left foot, pt states he does martial arts in his sleep and may have hit a piece of furniture 3 nights ago.

## 2015-09-21 ENCOUNTER — Emergency Department (HOSPITAL_COMMUNITY)
Admission: EM | Admit: 2015-09-21 | Discharge: 2015-09-21 | Disposition: A | Discharge status: Home / Self Care | Payer: Self-pay | Attending: Emergency Medicine | Admitting: Emergency Medicine

## 2015-09-21 ENCOUNTER — Encounter (HOSPITAL_COMMUNITY): Payer: Self-pay

## 2015-09-21 DIAGNOSIS — H6123 Impacted cerumen, bilateral: Secondary | ICD-10-CM | POA: Insufficient documentation

## 2015-09-21 DIAGNOSIS — Z23 Encounter for immunization: Secondary | ICD-10-CM | POA: Insufficient documentation

## 2015-09-21 DIAGNOSIS — Z720 Tobacco use: Secondary | ICD-10-CM | POA: Insufficient documentation

## 2015-09-21 MED ORDER — NEOMYCIN-POLYMYXIN-HC 3.5-10000-1 OT SUSP
3.0000 [drp] | Freq: Three times a day (TID) | OTIC | Status: DC
  Administered 2015-09-21: 3 [drp] via OTIC
  Filled 2015-09-21: qty 10

## 2015-09-21 MED ORDER — TETANUS-DIPHTH-ACELL PERTUSSIS 5-2.5-18.5 LF-MCG/0.5 IM SUSP
0.5000 mL | Freq: Once | INTRAMUSCULAR | Status: AC
  Administered 2015-09-21: 0.5 mL via INTRAMUSCULAR
  Filled 2015-09-21: qty 0.5

## 2015-09-21 MED ORDER — NEOMYCIN-COLIST-HC-THONZONIUM 3.3-3-10-0.5 MG/ML OT SUSP
3.0000 [drp] | Freq: Three times a day (TID) | OTIC | Status: DC
  Filled 2015-09-21: qty 5

## 2015-09-21 NOTE — ED Notes (Signed)
Pt presents with 2 day h/o bilateral ear pain.  Pt reports feeling "like something's fluttering in my ear".  Pt denies any drainage from either ear.

## 2015-09-21 NOTE — ED Provider Notes (Signed)
CSN: [id number]     Arrival date & time 09/21/15  1318 History  By signing my name below, I, Jay Potts, attest that this documentation has been prepared under the direction and in the presence of Kerrie Buffalo, NP. Electronically Signed: Placido Potts, ED Scribe. 09/21/2015. 2:06 PM.   No chief complaint on file.  The history is provided by the patient. No language interpreter was used.    HPI Comments: Jay Potts is a 54 y.o. male who presents to the Emergency Department complaining of possible insects in his bilateral ears with onset 1 day ago. Pt notes that "it feels like a big one in my left ear and a small one in my right". He notes that he heard a fluttering but denies any pain, discharge or other associated symptoms.   Past Medical History  Diagnosis Date  . Alcohol abuse    Past Surgical History  Procedure Laterality Date  . Tonsillectomy     History reviewed. No pertinent family history. Social History  Substance Use Topics  . Smoking status: Current Every Day Smoker -- 0.50 packs/day  . Smokeless tobacco: None  . Alcohol Use: Yes     Comment: ETOH abuse x30 years, here for med clearance Dec 2015    Review of Systems  HENT: Positive for ear pain (fullness). Negative for ear discharge.   All other systems reviewed and are negative.  Allergies  Prednisone  Home Medications   Prior to Admission medications   Medication Sig Start Date End Date Taking? Authorizing Provider  HYDROcodone-acetaminophen (NORCO/VICODIN) 5-325 MG per tablet Take 1-2 tablets by mouth every 6 (six) hours as needed for moderate pain or severe pain. 12/27/14   Ladona Mow, PA-C   BP 105/60 mmHg  Pulse 90  Temp(Src) 98.6 F (37 C) (Oral)  Resp 18  Ht  (1.727 m)  Wt 150 lb (68.04 kg)  BMI 22.81 kg/m2  SpO2 99% Physical Exam  Constitutional: He is oriented to person, place, and time. He appears well-developed and well-nourished.  HENT:  Head: Normocephalic and atraumatic.   Mouth/Throat: No oropharyngeal exudate.  Cerumen blockage in bilateral ears  Neck: Normal range of motion. No tracheal deviation present.  Cardiovascular: Normal rate.   Pulmonary/Chest: Effort normal. No respiratory distress.  Abdominal: Soft. There is no tenderness.  Musculoskeletal: Normal range of motion.  Neurological: He is alert and oriented to person, place, and time.  Skin: Skin is warm and dry. He is not diaphoretic.  Psychiatric: He has a normal mood and affect. His behavior is normal.  Nursing note and vitals reviewed.  ED Course  Procedures  Ear irrigation with large cerumen removed from each ear. Patient tolerated procedure without any immediate problems;\ Patient states feels much better and can hear better;  Re examined and TM normal Cortisporin Otic Suspension to each ear.  Tetanus updated per patient request  DIAGNOSTIC STUDIES: Oxygen Saturation is 99% on RA, normal by my interpretation.    COORDINATION OF CARE: 2:04 PM Discussed treatment plan with pt at bedside and pt agreed to plan.   MDM  54 y.o. male with bilateral ear fullness that resolved with cerumen removal.  He will follow up with his PCP or return here as needed.   Final diagnoses:  Cerumen impaction, bilateral   I personally performed the services described in this documentation, which was scribed in my presence. The recorded information has been reviewed and is accurate.    [facility] Orlene Och, NP 09/21/15 1716  Jay Potts  Gearldine Bienenstock, MD 09/21/15 2055

## 2015-10-11 ENCOUNTER — Emergency Department (HOSPITAL_COMMUNITY)
Admission: EM | Admit: 2015-10-11 | Discharge: 2015-10-12 | Disposition: A | Discharge status: Other Acute Inpatient Hospital | Payer: Self-pay | Attending: Emergency Medicine | Admitting: Emergency Medicine

## 2015-10-11 DIAGNOSIS — F10230 Alcohol dependence with withdrawal, uncomplicated: Secondary | ICD-10-CM

## 2015-10-11 DIAGNOSIS — F10920 Alcohol use, unspecified with intoxication, uncomplicated: Secondary | ICD-10-CM

## 2015-10-11 DIAGNOSIS — F1994 Other psychoactive substance use, unspecified with psychoactive substance-induced mood disorder: Secondary | ICD-10-CM

## 2015-10-11 DIAGNOSIS — R45851 Suicidal ideations: Secondary | ICD-10-CM

## 2015-10-11 DIAGNOSIS — Z720 Tobacco use: Secondary | ICD-10-CM | POA: Insufficient documentation

## 2015-10-11 LAB — RAPID URINE DRUG SCREEN, HOSP PERFORMED
Amphetamines: NOT DETECTED
BARBITURATES: NOT DETECTED
Benzodiazepines: NOT DETECTED
Cocaine: NOT DETECTED
Opiates: NOT DETECTED
TETRAHYDROCANNABINOL: NOT DETECTED

## 2015-10-11 LAB — COMPREHENSIVE METABOLIC PANEL
ALBUMIN: 4.4 g/dL (ref 3.5–5.0)
ALK PHOS: 116 U/L (ref 38–126)
ALT: 24 U/L (ref 17–63)
AST: 29 U/L (ref 15–41)
Anion gap: 6 (ref 5–15)
BUN: <5 mg/dL — ABNORMAL LOW (ref 6–20)
CO2: 28 mmol/L (ref 22–32)
CREATININE: 0.71 mg/dL (ref 0.61–1.24)
Calcium: 8.8 mg/dL — ABNORMAL LOW (ref 8.9–10.3)
Chloride: 105 mmol/L (ref 101–111)
GFR calc non Af Amer: >60 mL/min (ref >60)
GLUCOSE: 101 mg/dL — AB (ref 65–99)
Potassium: 3.6 mmol/L (ref 3.5–5.1)
SODIUM: 139 mmol/L (ref 135–145)
Total Bilirubin: 0.6 mg/dL (ref 0.3–1.2)
Total Protein: 8 g/dL (ref 6.5–8.1)

## 2015-10-11 LAB — CBC WITH DIFFERENTIAL/PLATELET
BASOS PCT: 0 %
Basophils Absolute: 0 10*3/uL (ref 0.0–0.1)
EOS PCT: 1 %
Eosinophils Absolute: 0.1 10*3/uL (ref 0.0–0.7)
HCT: 44.9 % (ref 39.0–52.0)
HEMOGLOBIN: 15.4 g/dL (ref 13.0–17.0)
Lymphocytes Relative: 30 %
Lymphs Abs: 1.6 10*3/uL (ref 0.7–4.0)
MCH: 31.4 pg (ref 26.0–34.0)
MCHC: 34.3 g/dL (ref 30.0–36.0)
MCV: 91.4 fL (ref 78.0–100.0)
Monocytes Absolute: 0.3 10*3/uL (ref 0.1–1.0)
Monocytes Relative: 5 %
NEUTROS PCT: 64 %
Neutro Abs: 3.5 10*3/uL (ref 1.7–7.7)
Platelets: 325 10*3/uL (ref 150–400)
RBC: 4.91 MIL/uL (ref 4.22–5.81)
RDW: 13.2 % (ref 11.5–15.5)
WBC: 5.4 10*3/uL (ref 4.0–10.5)

## 2015-10-11 LAB — ETHANOL: Alcohol, Ethyl (B): 268 mg/dL — ABNORMAL HIGH (ref <5)

## 2015-10-11 MED ORDER — LORAZEPAM 1 MG PO TABS
0.0000 mg | ORAL_TABLET | Freq: Four times a day (QID) | ORAL | Status: DC
  Administered 2015-10-11: 1 mg via ORAL
  Filled 2015-10-11: qty 1

## 2015-10-11 MED ORDER — LORAZEPAM 1 MG PO TABS: 0.0000 mg | ORAL_TABLET | Freq: Two times a day (BID) | ORAL | Status: DC

## 2015-10-11 MED ORDER — ZOLPIDEM TARTRATE 5 MG PO TABS: 5.0000 mg | ORAL_TABLET | Freq: Every evening | ORAL | Status: DC | PRN

## 2015-10-11 MED ORDER — ONDANSETRON HCL 4 MG PO TABS: 4.0000 mg | ORAL_TABLET | Freq: Three times a day (TID) | ORAL | Status: DC | PRN

## 2015-10-11 MED ORDER — IBUPROFEN 200 MG PO TABS
600.0000 mg | ORAL_TABLET | Freq: Three times a day (TID) | ORAL | Status: DC | PRN
  Administered 2015-10-11 – 2015-10-12 (×2): 600 mg via ORAL
  Filled 2015-10-11 (×2): qty 3

## 2015-10-11 MED ORDER — THIAMINE HCL 100 MG/ML IJ SOLN: 100.0000 mg | Freq: Every day | INTRAMUSCULAR | Status: DC

## 2015-10-11 MED ORDER — NICOTINE 21 MG/24HR TD PT24
21.0000 mg | MEDICATED_PATCH | Freq: Every day | TRANSDERMAL | Status: DC
  Administered 2015-10-11 – 2015-10-12 (×2): 21 mg via TRANSDERMAL
  Filled 2015-10-11 (×2): qty 1

## 2015-10-11 MED ORDER — VITAMIN B-1 100 MG PO TABS
100.0000 mg | ORAL_TABLET | Freq: Every day | ORAL | Status: DC
  Administered 2015-10-11 – 2015-10-12 (×2): 100 mg via ORAL
  Filled 2015-10-11 (×2): qty 1

## 2015-10-11 MED ORDER — ALUM & MAG HYDROXIDE-SIMETH 200-200-20 MG/5ML PO SUSP: 30.0000 mL | ORAL | Status: DC | PRN
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
## 2015-10-11 NOTE — ED Notes (Signed)
Provider finishing the triage assessment

## 2015-10-11 NOTE — ED Notes (Signed)
Bed: [facility] Expected date:  Expected time:  Means of arrival:  Comments: 

## 2015-10-11 NOTE — ED Notes (Signed)
Pt has pants, tennis shoes, socks t-shirt and gray jacket all in pt belongings bag. Bag was given to tech in the 9-25  Area with  Pt name on bag.

## 2015-10-11 NOTE — BH Assessment (Signed)
Assessment completed. Consulted Hulan FessIjeoma Nwaeze, NP who recommended that pt be re-evaluated in the am. Pt is currently intoxicated. Informed Fayrene HelperBowie Tran, PA-C of the recommendation.

## 2015-10-11 NOTE — ED Notes (Signed)
Pt has 1 pt belongings bag and 1 green backpack.

## 2015-10-11 NOTE — ED Notes (Signed)
Patient is A&O x 3. Ambulated to unit with steady gait. No distress noted.  Positive SI with a plan to lay on the railroad tracks. Contracted for safety. Denies HI  and AVH.  Patient was cooperative but expressed anger about being homeless, unemployed and without transportation. Mood is labile. Q 15 minute checks in progress. Monitored for safety.
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
## 2015-10-12 ENCOUNTER — Observation Stay (HOSPITAL_COMMUNITY)
Admission: AD | Admit: 2015-10-12 | Discharge: 2015-10-13 | Disposition: A | Discharge status: Home / Self Care | Payer: Federal, State, Local not specified - Other | Source: Intra-hospital | Attending: Psychiatry | Admitting: Psychiatry

## 2015-10-12 ENCOUNTER — Encounter (HOSPITAL_COMMUNITY): Payer: Self-pay | Admitting: *Deleted

## 2015-10-12 DIAGNOSIS — F17210 Nicotine dependence, cigarettes, uncomplicated: Secondary | ICD-10-CM | POA: Insufficient documentation

## 2015-10-12 DIAGNOSIS — Z590 Homelessness: Secondary | ICD-10-CM | POA: Insufficient documentation

## 2015-10-12 DIAGNOSIS — Z888 Allergy status to other drugs, medicaments and biological substances status: Secondary | ICD-10-CM | POA: Diagnosis not present

## 2015-10-12 DIAGNOSIS — F1994 Other psychoactive substance use, unspecified with psychoactive substance-induced mood disorder: Secondary | ICD-10-CM

## 2015-10-12 DIAGNOSIS — F10239 Alcohol dependence with withdrawal, unspecified: Secondary | ICD-10-CM | POA: Insufficient documentation

## 2015-10-12 DIAGNOSIS — F1290 Cannabis use, unspecified, uncomplicated: Secondary | ICD-10-CM | POA: Insufficient documentation

## 2015-10-12 DIAGNOSIS — F10230 Alcohol dependence with withdrawal, uncomplicated: Secondary | ICD-10-CM

## 2015-10-12 DIAGNOSIS — F1490 Cocaine use, unspecified, uncomplicated: Secondary | ICD-10-CM | POA: Diagnosis not present

## 2015-10-12 DIAGNOSIS — F10229 Alcohol dependence with intoxication, unspecified: Principal | ICD-10-CM | POA: Insufficient documentation

## 2015-10-12 MED ORDER — IBUPROFEN 600 MG PO TABS
600.0000 mg | ORAL_TABLET | Freq: Four times a day (QID) | ORAL | Status: DC | PRN
  Administered 2015-10-12 – 2015-10-13 (×2): 600 mg via ORAL
  Filled 2015-10-12 (×2): qty 1

## 2015-10-12 MED ORDER — ADULT MULTIVITAMIN W/MINERALS CH
1.0000 | ORAL_TABLET | Freq: Every day | ORAL | Status: DC
  Administered 2015-10-12 – 2015-10-13 (×2): 1 via ORAL
  Filled 2015-10-12 (×2): qty 1

## 2015-10-12 MED ORDER — ONDANSETRON 4 MG PO TBDP: 4.0000 mg | ORAL_TABLET | Freq: Four times a day (QID) | ORAL | Status: DC | PRN

## 2015-10-12 MED ORDER — LORAZEPAM 1 MG PO TABS: 1.0000 mg | ORAL_TABLET | Freq: Four times a day (QID) | ORAL | Status: DC | PRN

## 2015-10-12 MED ORDER — LOPERAMIDE HCL 2 MG PO CAPS: 2.0000 mg | ORAL_CAPSULE | ORAL | Status: DC | PRN

## 2015-10-12 MED ORDER — ACETAMINOPHEN 325 MG PO TABS: 650.0000 mg | ORAL_TABLET | Freq: Four times a day (QID) | ORAL | Status: DC | PRN

## 2015-10-12 MED ORDER — TRAZODONE HCL 50 MG PO TABS
50.0000 mg | ORAL_TABLET | Freq: Every evening | ORAL | Status: DC | PRN
  Administered 2015-10-12: 50 mg via ORAL
  Filled 2015-10-12: qty 1

## 2015-10-12 MED ORDER — MAGNESIUM HYDROXIDE 400 MG/5ML PO SUSP: 30.0000 mL | Freq: Every day | ORAL | Status: DC | PRN

## 2015-10-12 MED ORDER — ALUM & MAG HYDROXIDE-SIMETH 200-200-20 MG/5ML PO SUSP: 30.0000 mL | ORAL | Status: DC | PRN

## 2015-10-12 MED ORDER — HYDROXYZINE HCL 25 MG PO TABS: 25.0000 mg | ORAL_TABLET | Freq: Four times a day (QID) | ORAL | Status: DC | PRN

## 2015-10-12 NOTE — ED Notes (Signed)
Pt transported via [facility] transportation [facility].

## 2015-10-12 NOTE — Progress Notes (Signed)
Patient ID: Jay Potts, male   DOB: [birth year], 54 y.o.   MRN: [id number] Admission Note-[facility] was admitted for alcohol intoxication and threats of suicide with a plan to lay down and be cut in half by a train. He states now he doesn't remember saying that and must have said it in a drunken state and on impulse. He denies being suicidal now and is not homicidal. He has had 5 years of sobriety in his life. He is currently homeless and has been for 7 years. 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 He would like to go from here to a longer term rehab facility. He denies any medical concerns other than a "torked out L shoulder" He is pleasant and appropriate. Denies any sx of withdrawal. Skin search done, and oriented to unit and gave him lunch.

## 2015-10-12 NOTE — Consult Note (Signed)
[facility] Consult   Reason for Consult:  Alcohol intoxication with suicidal ideations Referring Physician:  EDP Patient Identification: Jay Potts MRN:  [id number] Principal Diagnosis: Substance induced mood disorder ([place]) Diagnosis:   Patient Active Problem List   Diagnosis Date Noted  . Substance induced mood disorder ([place]) [F19.94] 10/12/2015    Priority: High  . Alcohol dependence  [F10.20] 12/01/2014    Priority: High  . Alcohol dependence with uncomplicated withdrawal ([place]) [F10.230] 12/01/2014    Priority: High    Total Time spent with patient: 45 minutes  Subjective:   Jay Potts is a 53 y.o. male patient admitted to [facility] observation.  HPI:  54 yo male who presented to the ED with alcohol intoxication and suicidal ideations, history of alcohol dependence.  Last night, he started having suicidal ideations last night after drinking with an increase in his depression.  Jay Potts has been drinking a pint and a 1/2 of gin, 3 x week.  Last detox was at [facility] in June/July.  Denies suicidal/homicidal ideations, hallucinations, and drug abuse on assessment.  Past Psychiatric History: alcohol dependence  Risk to Self: Suicidal Ideation: Yes-Currently Present Suicidal Intent: Yes-Currently Present Is patient at risk for suicide?: Yes Suicidal Plan?: Yes-Currently Present Specify Current Suicidal Plan: Pt reported that he thought about cutting himself in half by laying on the road track.  Access to Means: Yes Specify Access to Suicidal Means: Road  What has been your use of drugs/alcohol within the last 12 months?: Alcohol use reported.  How many times?: 1 Other Self Harm Risks: No other self harm risk identified at this time.  Triggers for Past Attempts: Unpredictable Intentional Self Injurious Behavior: None Risk to Others: Homicidal Ideation:  ("Is this going to court?") Thoughts of Harm to Others:  ("Is this going to court?") Current Homicidal Intent:  ("Is this  going to court?") Current Homicidal Plan:  ("Is this going to court?") Access to Homicidal Means:  (unable to assess ) Identified Victim: N/A History of harm to others?: No Assessment of Violence: On admission Violent Behavior Description: No violent behaviors observed. Pt is calm and cooperative at this time.  Does patient have access to weapons?: No Criminal Charges Pending?: No Does patient have a court date: No Prior Inpatient Therapy: Prior Inpatient Therapy: Yes Prior Therapy Dates: 2013, 2015, 2016 Prior Therapy Facilty/Provider(s): [facility], [facility], [place] Reason for Treatment: Substance abuse treatment  Prior Outpatient Therapy: Prior Outpatient Therapy: No Does patient have an ACCT team?: No Does patient have Intensive In-House Services?  : No Does patient have [facility] services? : No Does patient have P4CC services?: No  Past Medical History:  Past Medical History  Diagnosis Date  . Alcohol abuse     Past Surgical History  Procedure Laterality Date  . Tonsillectomy     Family History: No family history on file. Family Psychiatric  History: Substance abuse Social History:  History  Alcohol Use  . Yes    Comment: ETOH abuse x30 years, here for med clearance Dec 2015     History  Drug Use  . Yes  . Special: Marijuana, Cocaine    Comment: says smokes pot @ monthly, does crack about once/yr    Social History   Social History  . Marital Status: Single    Spouse Name: N/A  . Number of Children: N/A  . Years of Education: N/A   Social History Main Topics  . Smoking status: Current Every Day Smoker -- 0.50 packs/day  . Smokeless  tobacco: Not on file  . Alcohol Use: Yes     Comment: ETOH abuse x30 years, here for med clearance Dec 2015  . Drug Use: Yes    Special: Marijuana, Cocaine     Comment: says smokes pot @ monthly, does crack about once/yr  . Sexual Activity: Not on file   Other Topics Concern  . Not on file   Social History Narrative   Additional  Social History:    History of alcohol / drug use?: Yes Name of Substance 1: Alcohol  1 - Age of First Use: 15 1 - Frequency: 2-3 wkly  1 - Duration: ongoing  1 - Last Use / Amount: 10-11-15 BAL-pending                    Allergies:   Allergies  Allergen Reactions  . Prednisone Palpitations    Labs:  Results for orders placed or performed during the hospital encounter of 10/11/15 (from the past 48 hour(s))  CBC with Differential/Platelet     Status: None   Collection Time: 10/11/15  7:26 PM  Result Value Ref Range   WBC 5.4 4.0 - 10.5 K/uL   RBC 4.91 4.22 - 5.81 MIL/uL   Hemoglobin 15.4 13.0 - 17.0 g/dL   HCT 44.9 39.0 - 52.0 %   MCV 91.4 78.0 - 100.0 fL   MCH 31.4 26.0 - 34.0 pg   MCHC 34.3 30.0 - 36.0 g/dL   RDW 13.2 11.5 - 15.5 %   Platelets 325 150 - 400 K/uL   Neutrophils Relative % 64 %   Neutro Abs 3.5 1.7 - 7.7 K/uL   Lymphocytes Relative 30 %   Lymphs Abs 1.6 0.7 - 4.0 K/uL   Monocytes Relative 5 %   Monocytes Absolute 0.3 0.1 - 1.0 K/uL   Eosinophils Relative 1 %   Eosinophils Absolute 0.1 0.0 - 0.7 K/uL   Basophils Relative 0 %   Basophils Absolute 0.0 0.0 - 0.1 K/uL  Comprehensive metabolic panel     Status: Abnormal   Collection Time: 10/11/15  7:26 PM  Result Value Ref Range   Sodium 139 135 - 145 mmol/L   Potassium 3.6 3.5 - 5.1 mmol/L   Chloride 105 101 - 111 mmol/L   CO2 28 22 - 32 mmol/L   Glucose, Bld 101 (H) 65 - 99 mg/dL   BUN <5 (L) 6 - 20 mg/dL   Creatinine, Ser 0.71 0.61 - 1.24 mg/dL   Calcium 8.8 (L) 8.9 - 10.3 mg/dL   Total Protein 8.0 6.5 - 8.1 g/dL   Albumin 4.4 3.5 - 5.0 g/dL   AST 29 15 - 41 U/L   ALT 24 17 - 63 U/L   Alkaline Phosphatase 116 38 - 126 U/L   Total Bilirubin 0.6 0.3 - 1.2 mg/dL   GFR calc non Af Amer >60 >60 mL/min   GFR calc Af Amer >60 >60 mL/min    Comment: (NOTE) The eGFR has been calculated using the CKD EPI equation. This calculation has not been validated in all clinical situations. eGFR's  persistently <60 mL/min signify possible Chronic Kidney Disease.    Anion gap 6 5 - 15  Ethanol     Status: Abnormal   Collection Time: 10/11/15  7:26 PM  Result Value Ref Range   Alcohol, Ethyl (B) 268 (H) <5 mg/dL    Comment:        LOWEST DETECTABLE LIMIT FOR SERUM ALCOHOL IS 5 mg/dL FOR MEDICAL  PURPOSES ONLY   Urine rapid drug screen (hosp performed)     Status: None   Collection Time: 10/11/15  9:43 PM  Result Value Ref Range   Opiates NONE DETECTED NONE DETECTED   Cocaine NONE DETECTED NONE DETECTED   Benzodiazepines NONE DETECTED NONE DETECTED   Amphetamines NONE DETECTED NONE DETECTED   Tetrahydrocannabinol NONE DETECTED NONE DETECTED   Barbiturates NONE DETECTED NONE DETECTED    Comment:        DRUG SCREEN FOR MEDICAL PURPOSES ONLY.  IF CONFIRMATION IS NEEDED FOR ANY PURPOSE, NOTIFY LAB WITHIN 5 DAYS.        LOWEST DETECTABLE LIMITS FOR URINE DRUG SCREEN Drug Class       Cutoff (ng/mL) Amphetamine      1000 Barbiturate      200 Benzodiazepine   650 Tricyclics       354 Opiates          300 Cocaine          300 THC              50     Current Facility-Administered Medications  Medication Dose Route Frequency Provider Last Rate Last Dose  . alum & mag hydroxide-simeth (MAALOX/MYLANTA) 200-200-20 MG/5ML suspension 30 mL  30 mL Oral PRN Domenic Moras, PA-C      . ibuprofen (ADVIL,MOTRIN) tablet 600 mg  600 mg Oral Q8H PRN Domenic Moras, PA-C   600 mg at 10/12/15 0931  . LORazepam (ATIVAN) tablet 0-4 mg  0-4 mg Oral 4 times per day Domenic Moras, PA-C   1 mg at 10/11/15 2109   Followed by  . [START ON 10/13/2015] LORazepam (ATIVAN) tablet 0-4 mg  0-4 mg Oral Q12H Domenic Moras, PA-C      . nicotine (NICODERM CQ - dosed in mg/24 hours) patch 21 mg  21 mg Transdermal Daily Domenic Moras, PA-C   21 mg at 10/12/15 0931  . ondansetron (ZOFRAN) tablet 4 mg  4 mg Oral Q8H PRN Domenic Moras, PA-C      . thiamine (VITAMIN B-1) tablet 100 mg  100 mg Oral Daily Domenic Moras, PA-C   100 mg at  10/12/15 6568   Or  . thiamine (B-1) injection 100 mg  100 mg Intravenous Daily Domenic Moras, PA-C      . zolpidem (AMBIEN) tablet 5 mg  5 mg Oral QHS PRN Domenic Moras, PA-C       Current Outpatient Prescriptions  Medication Sig Dispense Refill  . HYDROcodone-acetaminophen (NORCO/VICODIN) 5-325 MG per tablet Take 1-2 tablets by mouth every 6 (six) hours as needed for moderate pain or severe pain. (Patient not taking: Reported on 10/11/2015) 15 tablet 0    Musculoskeletal: Strength & Muscle Tone: within normal limits Gait & Station: normal Patient leans: N/A  Psychiatric Specialty Exam: Review of Systems  Constitutional: Negative.   HENT: Negative.   Eyes: Negative.   Respiratory: Negative.   Cardiovascular: Negative.   Gastrointestinal: Negative.   Genitourinary: Negative.   Musculoskeletal: Negative.   Skin: Negative.   Neurological: Negative.   Endo/Heme/Allergies: Negative.   Psychiatric/Behavioral: Positive for substance abuse.    Blood pressure 98/62, pulse 108, temperature 98.7 F (37.1 C), temperature source Oral, resp. rate 20, SpO2 98 %.There is no weight on file to calculate BMI.  General Appearance: Casual  Eye Contact::  Good  Speech:  Normal Rate  Volume:  Normal  Mood:  Depressed, mild  Affect:  Congruent  Thought Process:  Coherent  Orientation:  Full (Time, Place, and Person)  Thought Content:  Rumination  Suicidal Thoughts:  No  Homicidal Thoughts:  No  Memory:  Immediate;   Good Recent;   Good Remote;   Good  Judgement:  Fair  Insight:  Fair  Psychomotor Activity:  Normal  Concentration:  Good  Recall:  Good  Fund of Knowledge:Good  Language: Good  Akathisia:  No  Handed:  Right  AIMS (if indicated):     Assets:  Leisure Time Physical Health Resilience  ADL's:  Intact  Cognition: WNL  Sleep:      Treatment Plan Summary: Daily contact with patient to assess and evaluate symptoms and progress in treatment, Medication management and Plan :   alcohol induced mood disorder: -Crisis stabilization -Medication management:  Ativan alcohol detox protocol in place along with CIWA -Substance abuse and individual counseling  Disposition: Supportive therapy provided about ongoing stressors.  Waylan Boga, [place] 10/12/2015 10:26 AM Patient seen face-to-face for psychiatric evaluation, chart reviewed and case discussed with the physician extender and developed treatment plan. Reviewed the information documented and agree with the treatment plan. Corena Pilgrim, MD

## 2015-10-12 NOTE — BH Assessment (Signed)
This Clinical research associatewriter spoke with patient on 10/12/15 at 8:00 p.m.Marland Kitchen. Patient currently expresses no SI/HI, no plan or intent. Makinlee Awwad K. Yvette RackHarris, LCASA, LPCA, [facility]  Counselor 10/12/2015 8:29 PM

## 2015-10-12 NOTE — [facility] Counselor (Signed)
Spoke with Alinda MoneyMelvin from [facility] and referral papers have been sent to [facility] for review on 10/12/15 9:19 p.m. Contact is [phone]. Ernst Cumpston K. Sherlon HandingHarris, LCAS-A, LPC-A, [facility]  Counselor 10/12/2015 9:20 PM

## 2015-10-12 NOTE — BH Assessment (Signed)
[facility] Assessment Progress Note  Patient presents this date stating he was under an IVC earlier this date although it was rescinded and patient was transferred to the Observational Unit. Patient stated he had been using large amounts of alcohol for "some time" reporting using a pint of gin yesterday and blacking out. Patient denies any SI this date but states he needs residential care after discharge. Patient has had several attempts at residential treatment but soon relapsed after discharged. Patient is interested in [facility] in [place] upon discharge. This Clinical research associatewriter contacted [facility] and they informed this Clinical research associatewriter to have patient call for an interview upon discharge. [phone].

## 2015-10-12 NOTE — BH Assessment (Signed)
[facility] Assessment Progress Note  Per Thedore MinsMojeed Akintayo, MD, [facility].  Berneice Heinrichina Tate, RN, [facility] Obs [facility].  Pt presents under IVC which has been rescinded by Dr Jannifer FranklinAkintayo.  Pt has signed Voluntary Admission and Consent for Treatment, as well as Consent to Release Information, and signed [facility].  Pt's nurse has been notified, and agrees to send original paperwork along with pt via [place], and to call report to [phone].  Doylene Canninghomas Diallo Ponder, MA Triage Specialist [phone]

## 2015-10-12 NOTE — Progress Notes (Signed)
Resting, watching TV and reacting appropriately to funny movie.  Walking around.  Still expresses sore shoulder. states he is hungry. Suggested  repositions for comfort for shoulder. Offered snacks. States shoulder feels better, has eaten a back of snacks.  Denies SI/HI. Continue to monitor for improvement.   Feels better after he had a snack and observed him shaving.
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
## 2015-10-12 NOTE — BHH Counselor (Signed)
Melvin from [facility] called regarding patient referral review. Alinda MoneyMelvin stated that at this time he could not accept the patient at [facility] and recommended a higher level of care. However, Alinda MoneyMelvin did state that we should cal, back at or around 11:30 am-12:p.m. Tomorrow morning and speak with nurse at [facility] for further review. Alinda MoneyMelvin also recommended that the patient be [facility] and this may help with the process of further review as well. Island Dohmen K. Jesus GeneraHarris, LPC-A, LCAS-A, [facility]  Counselor 10/12/2015 9:46 PM

## 2015-10-13 DIAGNOSIS — F10229 Alcohol dependence with intoxication, unspecified: Secondary | ICD-10-CM | POA: Diagnosis not present

## 2015-10-13 DIAGNOSIS — F10230 Alcohol dependence with withdrawal, uncomplicated: Secondary | ICD-10-CM

## 2015-10-13 MED ORDER — NICOTINE 21 MG/24HR TD PT24
MEDICATED_PATCH | TRANSDERMAL | Status: AC
  Administered 2015-10-13: 21 mg
  Filled 2015-10-13: qty 1

## 2015-10-13 MED ORDER — NICOTINE 21 MG/24HR TD PT24
21.0000 mg | MEDICATED_PATCH | Freq: Every day | TRANSDERMAL | Status: DC
Start: 2015-10-13 — End: 2015-10-13

## 2015-10-13 NOTE — Progress Notes (Signed)
Patient ID: Jay Potts, male   DOB: 28-Sep-1961, 54 y.o.   MRN: [id number] Phillip HealJohn verbalizes understanding of all d/c instuctions.  He has comitted to 90 meetings in 90 days with 12 step recovery.  He denies SI or any thoughts of self harm.  Denies S/S of withdrawal or any physical complaint.  Belongings returned and bus pass given. Escorted to lobby in NAD.

## 2015-10-13 NOTE — Discharge Instructions (Signed)
See [facility]
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
## 2015-10-13 NOTE — Progress Notes (Signed)
D- Patient is awake and alert this am.  Spoke freely of 12 step recovery and desire for long term treatment.  No physical complaints.  Appetite is good and pt reports good sleep.  Denies SI. A-  Mood is positive and Jay Potts has no overt complaints of etoh withdrawal.  Positive support and encouragement.  R- Physical/emotional safety maintained.

## 2015-10-13 NOTE — BHH Counselor (Signed)
[facility]. Patient completed pre-screen and referral papers were faxed for intake consideration for [facility] a.m. Shift. Awaiting confirmation, if [facility] has not called back [facility] is to contact. Once accepted pt[facility]. Florence Yeung K. Tiburcio PeaHarris, LPC-A, LCAS-A, [facility]  Counselor 10/13/2015 6:05 AM

## 2015-10-13 NOTE — Progress Notes (Signed)
Pt complained of back pain.  600mg  of Motrin given at 0536.

## 2015-10-13 NOTE — BHH Counselor (Signed)
[facility] Assessment Progress Note  [facility]'s [facility] information. She indicated that, based on pt information, he would not qualify for detox. Edson SnowballShana spoke to pt, at length, and informed this counselor that she offered to see if pt would be able to come for trmt, but pt indicated he was not interested in coming there for treatment.   Counselor ver'd w/ pt that he was not interested in treatment, at this time. Pt stated that he was just ready to be d/c. He indicated that, upon d/c, he would "[facility] [employer] and get online for awhile". Nurse, Carlisle BeersLuann, processed at length with pt about his options and future plans with regards to his alcoholism. Pt  indicated that he "just need to buckle down, get a job and go to meetings". Pt requested a copy of local AA & NA meetings and gave his word to Luann that he would go to a meeting after d/c and another meeting later in the afternoon. Counselor gave pt a copy of local AA & NA meetings.   Johny ShockSamantha M. Ladona Ridgelaylor, MS, NCC, [facility] Counselor

## 2015-10-13 NOTE — Discharge Summary (Signed)
OBS Discharge Summary Note  Patient:  Jay Potts is an 54 y.o., male MRN:  [id number] DOB:  1961/07/29 Patient phone:  [phone] (home)  Patient address:   [address] [place] [address],  Total Time spent with patient: 30 minutes  Date of Admission:  10/12/2015 Date of Discharge: 10/13/2015  Reason for Admission:  Substance abuse  Principal Problem: Alcohol dependence with uncomplicated withdrawal [facility]) Discharge Diagnoses: Patient Active Problem List   Diagnosis Date Noted  . Alcohol dependence with uncomplicated withdrawal ([place]) [F10.230] 12/01/2014    Priority: High  . Substance induced mood disorder ([place]) [F19.94] 10/12/2015  . Alcohol dependence  [F10.20] 12/01/2014    Musculoskeletal: Strength & Muscle Tone: within normal limits Gait & Station: normal Patient leans: N/A  Psychiatric Specialty Exam: Physical Exam  Vitals reviewed. Psychiatric: His mood appears not anxious. Thought content is not paranoid. He does not exhibit a depressed mood. He expresses no homicidal and no suicidal ideation.    Review of Systems  Psychiatric/Behavioral: Negative for suicidal ideas and hallucinations. The patient is not nervous/anxious.   All other systems reviewed and are negative.   Blood pressure 129/67, pulse 90, temperature 97.6 F (36.4 C), temperature source Oral, height 5' 9"  (1.753 m), weight 68.947 kg (152 lb), SpO2 99 %.Body mass index is 22.44 kg/(m^2).   General Appearance: Disheveled  Eye Contact:: Good  Speech: Clear and Coherent and Normal Rate  Volume: Normal  Mood: Euthymic  Affect: Appropriate and Congruent  Thought Process: Coherent, Goal Directed, Intact and Logical  Orientation: Full (Time, Place, and Person)  Thought Content: WDL  Suicidal Thoughts: No  Homicidal Thoughts: No  Memory: Immediate; Good Recent; Fair Remote; Fair  Judgement: Fair  Insight: Fair  Psychomotor Activity: Normal   Concentration: Fair  Recall: [employer] of Knowledge:Good  Language: Good  Akathisia: Negative  Handed: Right  AIMS (if indicated):    Assets: Communication Skills Desire for Improvement Leisure Time Physical Health Resilience  ADL's: Intact  Cognition: WNL  Sleep:          Have you used any form of tobacco in the last 30 days? (Cigarettes, Smokeless Tobacco, Cigars, and/or Pipes): Yes  Has this patient used any form of tobacco in the last 30 days? (Cigarettes, Smokeless Tobacco, Cigars, and/or Pipes) N/A  Past Medical History:  Past Medical History  Diagnosis Date  . Alcohol abuse     Past Surgical History  Procedure Laterality Date  . Tonsillectomy     Family History: History reviewed. No pertinent family history. Social History:  History  Alcohol Use  . Yes    Comment: ETOH abuse x30 years, here for med clearance Dec 2015     History  Drug Use  . Yes  . Special: Marijuana, Cocaine    Comment: says smokes pot @ monthly, does crack about once/yr    Social History   Social History  . Marital Status: Single    Spouse Name: N/A  . Number of Children: N/A  . Years of Education: N/A   Social History Main Topics  . Smoking status: Current Every Day Smoker -- 0.50 packs/day  . Smokeless tobacco: None  . Alcohol Use: Yes     Comment: ETOH abuse x30 years, here for med clearance Dec 2015  . Drug Use: Yes    Special: Marijuana, Cocaine     Comment: says smokes pot @ monthly, does crack about once/yr  . Sexual Activity: Not Currently   Other  Topics Concern  . None   Social History Narrative   Risk to Self: Is patient at risk for suicide?: No Risk to Others:   Prior Inpatient Therapy:   Prior Outpatient Therapy:    Level of Care:  OP  Hospital Course:  Jay Potts was admitted to OBS unit for Alcohol dependence with uncomplicated withdrawal ([place]) and crisis management.  He was treated discharged with the medications listed below under  Medication List.  Medical problems were identified and treated as needed.  Home medications were restarted as appropriate.  Improvement was monitored by observation and Jay Potts daily report of symptom reduction.  Emotional and mental status was monitored by daily self-inventory reports completed by Jay Potts and clinical staff.         Jay Potts was evaluated by the treatment team for stability and plans for continued recovery upon discharge.  Jay Potts motivation was an integral factor for scheduling further treatment.  Employment, transportation, bed availability, health status, family support, and any pending legal issues were also considered during his hospital stay.  He was offered further treatment options upon discharge including but not limited to Residential, Intensive Outpatient, and Outpatient treatment.  Jay Potts will follow up with the services as listed below under Follow Up Information.     Upon completion of this admission the patient was both mentally and medically stable for discharge denying suicidal/homicidal ideation, auditory/visual/tactile hallucinations, delusional thoughts and paranoia.      Consults:  psychiatry  Significant Diagnostic Studies:  labs: per ED  Discharge Vitals:   Blood pressure 129/67, pulse 90, temperature 97.6 F (36.4 C), temperature source Oral, height 5' 9"  (1.753 m), weight 68.947 kg (152 lb), SpO2 99 %. Body mass index is 22.44 kg/(m^2). Lab Results:   Results for orders placed or performed during the hospital encounter of 10/11/15 (from the past 72 hour(s))  CBC with Differential/Platelet     Status: None   Collection Time: 10/11/15  7:26 PM  Result Value Ref Range   WBC 5.4 4.0 - 10.5 K/uL   RBC 4.91 4.22 - 5.81 MIL/uL   Hemoglobin 15.4 13.0 - 17.0 g/dL   HCT 44.9 39.0 - 52.0 %   MCV 91.4 78.0 - 100.0 fL   MCH 31.4 26.0 - 34.0 pg   MCHC 34.3 30.0 - 36.0 g/dL   RDW 13.2 11.5 - 15.5 %   Platelets 325 150 - 400 K/uL   Neutrophils  Relative % 64 %   Neutro Abs 3.5 1.7 - 7.7 K/uL   Lymphocytes Relative 30 %   Lymphs Abs 1.6 0.7 - 4.0 K/uL   Monocytes Relative 5 %   Monocytes Absolute 0.3 0.1 - 1.0 K/uL   Eosinophils Relative 1 %   Eosinophils Absolute 0.1 0.0 - 0.7 K/uL   Basophils Relative 0 %   Basophils Absolute 0.0 0.0 - 0.1 K/uL  Comprehensive metabolic panel     Status: Abnormal   Collection Time: 10/11/15  7:26 PM  Result Value Ref Range   Sodium 139 135 - 145 mmol/L   Potassium 3.6 3.5 - 5.1 mmol/L   Chloride 105 101 - 111 mmol/L   CO2 28 22 - 32 mmol/L   Glucose, Bld 101 (H) 65 - 99 mg/dL   BUN <5 (L) 6 - 20 mg/dL   Creatinine, Ser 0.71 0.61 - 1.24 mg/dL   Calcium 8.8 (L) 8.9 - 10.3 mg/dL   Total Protein 8.0 6.5 - 8.1 g/dL   Albumin 4.4 3.5 -  5.0 g/dL   AST 29 15 - 41 U/L   ALT 24 17 - 63 U/L   Alkaline Phosphatase 116 38 - 126 U/L   Total Bilirubin 0.6 0.3 - 1.2 mg/dL   GFR calc non Af Amer >60 >60 mL/min   GFR calc Af Amer >60 >60 mL/min    Comment: (NOTE) The eGFR has been calculated using the CKD EPI equation. This calculation has not been validated in all clinical situations. eGFR's persistently <60 mL/min signify possible Chronic Kidney Disease.    Anion gap 6 5 - 15  Ethanol     Status: Abnormal   Collection Time: 10/11/15  7:26 PM  Result Value Ref Range   Alcohol, Ethyl (B) 268 (H) <5 mg/dL    Comment:        LOWEST DETECTABLE LIMIT FOR SERUM ALCOHOL IS 5 mg/dL FOR MEDICAL PURPOSES ONLY   Urine rapid drug screen (hosp performed)     Status: None   Collection Time: 10/11/15  9:43 PM  Result Value Ref Range   Opiates NONE DETECTED NONE DETECTED   Cocaine NONE DETECTED NONE DETECTED   Benzodiazepines NONE DETECTED NONE DETECTED   Amphetamines NONE DETECTED NONE DETECTED   Tetrahydrocannabinol NONE DETECTED NONE DETECTED   Barbiturates NONE DETECTED NONE DETECTED    Comment:        DRUG SCREEN FOR MEDICAL PURPOSES ONLY.  IF CONFIRMATION IS NEEDED FOR ANY PURPOSE, NOTIFY  LAB WITHIN 5 DAYS.        LOWEST DETECTABLE LIMITS FOR URINE DRUG SCREEN Drug Class       Cutoff (ng/mL) Amphetamine      1000 Barbiturate      200 Benzodiazepine   676 Tricyclics       195 Opiates          300 Cocaine          300 THC              50     Physical Findings: AIMS: Facial and Oral Movements Muscles of Facial Expression: None, normal Lips and Perioral Area: None, normal Jaw: None, normal Tongue: None, normal,Extremity Movements Upper (arms, wrists, hands, fingers): None, normal Lower (legs, knees, ankles, toes): None, normal, Trunk Movements Neck, shoulders, hips: None, normal, Overall Severity Severity of abnormal movements (highest score from questions above): None, normal Incapacitation due to abnormal movements: None, normal Patient's awareness of abnormal movements (rate only patient's report): No Awareness, Dental Status Current problems with teeth and/or dentures?: No Does patient usually wear dentures?: No  CIWA:  CIWA-Ar Total: 1 COWS:      See Psychiatric Specialty Exam and Suicide Risk Assessment completed by Attending Physician prior to discharge.  Discharge destination:  Home  Is patient on multiple antipsychotic therapies at discharge:  No   Has Patient had three or more failed trials of antipsychotic monotherapy by history:  No    Recommended Plan for Multiple Antipsychotic Therapies: NA     Medication List    STOP taking these medications        HYDROcodone-acetaminophen 5-325 MG tablet  Commonly known as:  NORCO/VICODIN        Follow-up recommendations:  Activity:  as tol Diet:  as tol  Comments:  1.  Take all your medications as prescribed.              2.  Report any adverse side effects to outpatient provider.  Attend AA meetings in the past as it had been successful  and prolonged your period of sobriety.                       3.  Patient instructed to not use alcohol or illegal drugs while on prescription medicines.             4.  In the event of worsening symptoms, instructed patient to call 911, the crisis hotline or go to nearest emergency room for evaluation of symptoms.  Total Discharge Time: 30 min  Signed: Freda Munro May Agustin AGNP-BC 10/13/2015, 10:32 AM  Case discussed and agreed

## 2015-11-27 ENCOUNTER — Emergency Department (HOSPITAL_COMMUNITY): Payer: Self-pay

## 2015-11-27 ENCOUNTER — Encounter (HOSPITAL_COMMUNITY): Payer: Self-pay | Admitting: Emergency Medicine

## 2015-11-27 ENCOUNTER — Emergency Department (HOSPITAL_COMMUNITY)
Admission: EM | Admit: 2015-11-27 | Discharge: 2015-11-27 | Disposition: A | Discharge status: Home / Self Care | Payer: Self-pay | Attending: Emergency Medicine | Admitting: Emergency Medicine

## 2015-11-27 DIAGNOSIS — S5012XA Contusion of left forearm, initial encounter: Secondary | ICD-10-CM | POA: Insufficient documentation

## 2015-11-27 DIAGNOSIS — M79602 Pain in left arm: Secondary | ICD-10-CM

## 2015-11-27 DIAGNOSIS — W501XXA Accidental kick by another person, initial encounter: Secondary | ICD-10-CM | POA: Insufficient documentation

## 2015-11-27 DIAGNOSIS — F17200 Nicotine dependence, unspecified, uncomplicated: Secondary | ICD-10-CM | POA: Insufficient documentation

## 2015-11-27 DIAGNOSIS — Y9375 Activity, martial arts: Secondary | ICD-10-CM | POA: Insufficient documentation

## 2015-11-27 DIAGNOSIS — S5002XA Contusion of left elbow, initial encounter: Secondary | ICD-10-CM | POA: Insufficient documentation

## 2015-11-27 DIAGNOSIS — Y998 Other external cause status: Secondary | ICD-10-CM | POA: Insufficient documentation

## 2015-11-27 DIAGNOSIS — Y9289 Other specified places as the place of occurrence of the external cause: Secondary | ICD-10-CM | POA: Insufficient documentation

## 2015-11-27 LAB — BASIC METABOLIC PANEL
ANION GAP: 7 (ref 5–15)
BUN: <5 mg/dL — ABNORMAL LOW (ref 6–20)
CO2: 27 mmol/L (ref 22–32)
Calcium: 9.4 mg/dL (ref 8.9–10.3)
Chloride: 105 mmol/L (ref 101–111)
Creatinine, Ser: 0.86 mg/dL (ref 0.61–1.24)
GFR calc Af Amer: >60 mL/min (ref >60)
Glucose, Bld: 121 mg/dL — ABNORMAL HIGH (ref 65–99)
POTASSIUM: 4.4 mmol/L (ref 3.5–5.1)
SODIUM: 139 mmol/L (ref 135–145)

## 2015-11-27 LAB — CBC WITH DIFFERENTIAL/PLATELET
BASOS PCT: 1 %
Basophils Absolute: 0 10*3/uL (ref 0.0–0.1)
EOS ABS: 0 10*3/uL (ref 0.0–0.7)
EOS PCT: 1 %
HCT: 43.4 % (ref 39.0–52.0)
HEMOGLOBIN: 14.8 g/dL (ref 13.0–17.0)
LYMPHS ABS: 0.9 10*3/uL (ref 0.7–4.0)
Lymphocytes Relative: 14 %
MCH: 31.3 pg (ref 26.0–34.0)
MCHC: 34.1 g/dL (ref 30.0–36.0)
MCV: 91.8 fL (ref 78.0–100.0)
Monocytes Absolute: 0.3 10*3/uL (ref 0.1–1.0)
Monocytes Relative: 5 %
NEUTROS PCT: 79 %
Neutro Abs: 5.2 10*3/uL (ref 1.7–7.7)
PLATELETS: 269 10*3/uL (ref 150–400)
RBC: 4.73 MIL/uL (ref 4.22–5.81)
RDW: 12.8 % (ref 11.5–15.5)
WBC: 6.5 10*3/uL (ref 4.0–10.5)

## 2015-11-27 MED ORDER — IBUPROFEN 400 MG PO TABS
800.0000 mg | ORAL_TABLET | Freq: Once | ORAL | Status: AC
  Administered 2015-11-27: 800 mg via ORAL
  Filled 2015-11-27: qty 2
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
## 2015-11-27 NOTE — Discharge Instructions (Signed)
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
## 2015-11-27 NOTE — ED Notes (Signed)
Patient called x 2 with no answer 

## 2016-10-14 ENCOUNTER — Encounter (HOSPITAL_COMMUNITY): Payer: Self-pay | Admitting: Emergency Medicine

## 2016-10-14 ENCOUNTER — Emergency Department (HOSPITAL_COMMUNITY)
Admission: EM | Admit: 2016-10-14 | Discharge: 2016-10-14 | Disposition: A | Discharge status: Home / Self Care | Payer: Self-pay | Attending: Emergency Medicine | Admitting: Emergency Medicine

## 2016-10-14 ENCOUNTER — Emergency Department (HOSPITAL_COMMUNITY): Payer: Self-pay

## 2016-10-14 ENCOUNTER — Ambulatory Visit (HOSPITAL_COMMUNITY)
Admission: RE | Admit: 2016-10-14 | Discharge: 2016-10-14 | Disposition: A | Discharge status: Home / Self Care | Payer: Federal, State, Local not specified - Other | Attending: Psychiatry | Admitting: Psychiatry

## 2016-10-14 DIAGNOSIS — F1490 Cocaine use, unspecified, uncomplicated: Secondary | ICD-10-CM | POA: Insufficient documentation

## 2016-10-14 DIAGNOSIS — Z1389 Encounter for screening for other disorder: Secondary | ICD-10-CM | POA: Insufficient documentation

## 2016-10-14 DIAGNOSIS — F17200 Nicotine dependence, unspecified, uncomplicated: Secondary | ICD-10-CM | POA: Insufficient documentation

## 2016-10-14 DIAGNOSIS — F1290 Cannabis use, unspecified, uncomplicated: Secondary | ICD-10-CM | POA: Insufficient documentation

## 2016-10-14 DIAGNOSIS — F1910 Other psychoactive substance abuse, uncomplicated: Secondary | ICD-10-CM | POA: Insufficient documentation

## 2016-10-14 DIAGNOSIS — J40 Bronchitis, not specified as acute or chronic: Secondary | ICD-10-CM | POA: Insufficient documentation

## 2016-10-14 MED ORDER — ALBUTEROL SULFATE HFA 108 (90 BASE) MCG/ACT IN AERS
2.0000 | INHALATION_SPRAY | RESPIRATORY_TRACT | Status: DC | PRN
  Administered 2016-10-14: 2 via RESPIRATORY_TRACT
  Filled 2016-10-14: qty 6.7
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
## 2016-10-14 NOTE — H&P (Signed)
Behavioral Health Medical Screening Exam  [name] is an 55 y.o. male who presents as a walk in seeking help to stop using crack and alcohol. There are no active signs or symptoms of withdrawal. Patient denies any past medical history. He reports currently having a dry cough that "clears up in two days whenI am not using crack." Jay Potts reports using up to $800 dollars of crack last night. He is requesting referrals to help with drug abuse. Counselor explored residential treatment options but no beds available right now but patient will be provided with contact numbers. Also will provide patient with [place] [facility] [place] as he does not have any insurance.   Total Time spent with patient: 15 minutes  Psychiatric Specialty Exam: Physical Exam  Constitutional: He is oriented to person, place, and time. He appears well-developed and well-nourished.  HENT:  Head: Normocephalic and atraumatic.  Neck: Normal range of motion.  Cardiovascular: Normal rate, regular rhythm, normal heart sounds and intact distal pulses.   Respiratory: Effort normal and breath sounds normal.  GI: Soft. Bowel sounds are normal.  Musculoskeletal: Normal range of motion.  Neurological: He is alert and oriented to person, place, and time.  Skin: Skin is warm and dry.    Review of Systems  Respiratory: Positive for cough.   Psychiatric/Behavioral: Positive for depression and substance abuse. Negative for hallucinations, memory loss and suicidal ideas. The patient is nervous/anxious. The patient does not have insomnia.     Blood pressure (!) 110/93, pulse 91, temperature 97.9 F (36.6 C), resp. rate 16.There is no height or weight on file to calculate BMI.  General Appearance: Casual  Eye Contact:  Good  Speech:  Clear and Coherent  Volume:  Normal  Mood:  Anxious  Affect:  Appropriate  Thought Process:  Coherent and Goal Directed  Orientation:  Full (Time, Place, and Person)   Thought Content:  Desire to stop abusing alcohol and crack   Suicidal Thoughts:  No  Homicidal Thoughts:  No  Memory:  Immediate;   Good Recent;   Good Remote;   Good  Judgement:  Impaired  Insight:  Present  Psychomotor Activity:  Normal  Concentration: Concentration: Good and Attention Span: Good  Recall:  Good  Fund of Knowledge:Good  Language: Good  Akathisia:  No  Handed:  Right  AIMS (if indicated):     Assets:  Communication Skills Desire for Improvement Leisure Time Resilience Social Support  Sleep:       Musculoskeletal: Strength & Muscle Tone: within normal limits Gait & Station: normal Patient leans: N/A  Blood pressure (!) 110/93, pulse 91, temperature 97.9 F (36.6 C), resp. rate 16.  Recommendations:  Based on my evaluation the patient does not appear to have an emergency medical condition.  Fransisca KaufmannAVIS, Tia Gelb, NP 10/14/2016, 10:55 AM

## 2016-10-14 NOTE — ED Provider Notes (Signed)
[facility]-EMERGENCY DEPT Provider Note   CSN: [id number] Arrival date & time: 10/14/16  0111 By signing my name below, I, Levon HedgerElizabeth Hall, attest that this documentation has been prepared under the direction and in the presence of Gilda Creasehristopher J Uzma Hellmer, MDElectronically Signed: Levon HedgerElizabeth Hall, Scribe. 10/14/2016. 2:15 AM.   History   Chief Complaint Chief Complaint  Patient presents with  . Requesting Detox  . Alcohol Intoxication   HPI Jay Potts is a 55 y.o. male with hx of alcohol dependence and substance induced mood disorder who presents to the Emergency Department complaining of intoxication tonight. Pt endorses alcohol and drug use tonight; he states he is "coming off a lot of coke". Pt is requesting detox for alcohol. He states he has been on crack cocaine for four months. He denies any SI/HI, SOB, or any pain. Pt has no other complaints at this time.   The history is provided by the patient. No language interpreter was used.   Past Medical History:  Diagnosis Date  . Alcohol abuse     Patient Active Problem List   Diagnosis Date Noted  . Substance induced mood disorder (HCC) 10/12/2015  . Alcohol dependence  12/01/2014  . Alcohol dependence with uncomplicated withdrawal (HCC) 12/01/2014    Past Surgical History:  Procedure Laterality Date  . TONSILLECTOMY      Home Medications    Prior to Admission medications   Not on File    Family History No family history on file.  Social History Social History  Substance Use Topics  . Smoking status: Current Every Day Smoker    Packs/day: 0.50  . Smokeless tobacco: Never Used  . Alcohol use Yes     Comment: ETOH abuse x30 years, here for med clearance Dec 2015     Allergies   Prednisone  Review of Systems Review of Systems 10 systems reviewed and all are negative for acute change except as noted in the HPI.   Physical Exam Updated Vital Signs BP 143/85 (BP Location: Left Arm)   Pulse 108   Temp 97.9 F (36.6  C) (Oral)   Resp 23   SpO2 95%   Physical Exam  Constitutional: He is oriented to person, place, and time. He appears well-developed and well-nourished. No distress.  HENT:  Head: Normocephalic and atraumatic.  Right Ear: Hearing normal.  Left Ear: Hearing normal.  Nose: Nose normal.  Mouth/Throat: Oropharynx is clear and moist and mucous membranes are normal.  Eyes: Conjunctivae and EOM are normal. Pupils are equal, round, and reactive to light.  Neck: Normal range of motion. Neck supple.  Cardiovascular: Regular rhythm, S1 normal and S2 normal.  Exam reveals no gallop and no friction rub.   No murmur heard. Pulmonary/Chest: Effort normal and breath sounds normal. No respiratory distress. He exhibits no tenderness.  Abdominal: Soft. Normal appearance and bowel sounds are normal. There is no hepatosplenomegaly. There is no tenderness. There is no rebound, no guarding, no tenderness at McBurney's point and negative Murphy's sign. No hernia.  Musculoskeletal: Normal range of motion.  Neurological: He is alert and oriented to person, place, and time. He has normal strength. No cranial nerve deficit or sensory deficit. Coordination normal. GCS eye subscore is 4. GCS verbal subscore is 5. GCS motor subscore is 6.  Skin: Skin is warm, dry and intact. No rash noted. No cyanosis.  Psychiatric: He has a normal mood and affect. His speech is normal and behavior is normal. Thought content normal.  Nursing note and vitals  reviewed.  ED Treatments / Results  DIAGNOSTIC STUDIES:  Oxygen Saturation is 95% on RA, adequate by my interpretation.    COORDINATION OF CARE:  2:00 AM Discussed treatment plan with pt at bedside and pt agreed to plan.  Labs (all labs ordered are listed, but only abnormal results are displayed) Labs Reviewed - No data to display  EKG  EKG Interpretation None       Radiology Dg Chest 2 View  Result Date: 10/14/2016 CLINICAL DATA:  Acute onset of cough. Cocaine  detox. Initial encounter. EXAM: CHEST  2 VIEW COMPARISON:  None. FINDINGS: The lungs are well-aerated. Minimal left basilar atelectasis is noted. There is no evidence of pleural effusion or pneumothorax. The heart is normal in size; the mediastinal contour is within normal limits. No acute osseous abnormalities are seen. IMPRESSION: Minimal left basilar atelectasis noted.  Lungs otherwise clear. Electronically Signed   By: Roanna RaiderJeffery  Chang M.D.   On: 10/14/2016 03:07    Procedures Procedures (including critical care time)  Medications Ordered in ED Medications - No data to display   Initial Impression / Assessment and Plan / ED Course  I have reviewed the triage vital signs and the nursing notes.  Pertinent labs & imaging results that were available during my care of the patient were reviewed by me and considered in my medical decision making (see chart for details).  Clinical Course   Patient presents with concerns over cocaine and alcohol abuse. He reports a long history of alcohol abuse and has started using cocaine over the last 4 months. Patient apparently became distraught tonight because one of his acquaintances overdosed. Patient anxious at arrival but has no other complaints. He denies homicidality and suicidality, reports that he is safe. Patient informed that we do not have a detox program to placement but will be given resources.  She reports that he has had a persistent cough recently. He is not expressing shortness of breath. There is no hypoxia. Chest x-ray performed, is negative. Will treat with albuterol.  Final Clinical Impressions(s) / ED Diagnoses   Final diagnoses:  Polysubstance abuse  Bronchitis    New Prescriptions New Prescriptions   No medications on file  I personally performed the services described in this documentation, which was scribed in my presence. The recorded information has been reviewed and is accurate.    Gilda Creasehristopher J Kemiah Booz, MD 10/14/16  [phone]

## 2016-10-14 NOTE — BH Assessment (Addendum)
Tele Assessment Note  Pt presents voluntarily to [facility] after being d/c at [facility] early this am. Pt is cooperative and oriented x 4. He says he wants detox from alcohol and cocaine. Pt sts he drinks daily and last night drank 6 pack of beer and a pint of liquor. Pt sts he has used $800 to &1000 crack over past 3 days. Pt sts he hasn't slept for 3 days.Pt denies SI currently or at any time in the past. Pt denies any history of suicide attempts, or of self-mutilation. Pt denies homicidal thoughts or physical aggression. Pt denies having access to firearms. Pt denies having any legal problems at this time. Pt is calm and cooperative during assessment. Pt denies hallucinations. Pt does not appear to be responding to internal stimuli and exhibits no delusional thought. Pt's reality testing appears to be intact. He says he is staying in a motel. Pt reports that he had to do CPR on his male friend who overdosed on heroin late last night. Pt sts he doesn't know if she lived b/c he hasn't heard from her since EMS transported her to an ED. Pt reports he has a sister who is a physician in [place] but they haven't talked in years. Pt reports he is single and never married, no kids. Pt denies hx seizures. Pt says, "I've had enough". He wants to get away from [facility] for drug treatment. He endorses loss of interest in usual pleasures and fatigue. Pt reports [facility] treatment at [facility]. Pt has been inpt at [facility] x 2. Pt has a cough. He reports is from "crack addiction".    Jay Potts is an 55 y.o. male.   Diagnosis:  Alcohol Use Disorder, Severe Cocaine Use Disorder, Severe  Past Medical History:  Past Medical History:  Diagnosis Date  . Alcohol abuse     Past Surgical History:  Procedure Laterality Date  . TONSILLECTOMY      Family History: No family history on file.  Social History:  reports that he has been smoking.  He has been smoking about 0.50 packs per day. He has never used smokeless tobacco. He  reports that he drinks alcohol. He reports that he uses drugs, including Marijuana and Cocaine.  Additional Social History:     CIWA: CIWA-Ar BP: 133/85 Pulse Rate: 93 COWS: Clinical Opiate Withdrawal Scale (COWS) Resting Pulse Rate: Pulse Rate 101-120 Sweating: No report of chills or flushing Restlessness: Able to sit still Pupil Size: Pupils possibly larger than normal for room light Bone or Joint Aches: Not present Runny Nose or Tearing: Not present GI Upset: No GI symptoms Tremor: No tremor Yawning: No yawning Anxiety or Irritability: None Gooseflesh Skin: Skin is smooth COWS Total Score: 3  PATIENT STRENGTHS: (choose at least two) Average or above average intelligence Capable of independent living Communication skills Physical Health  Allergies:  Allergies  Allergen Reactions  . Prednisone Palpitations    Home Medications:  (Not in a hospital admission)  OB/GYN Status:  No LMP for male patient.  General Assessment Data TTS Assessment: In system Is this an Initial Assessment or a Re-assessment for this encounter?: Initial Assessment Marital status: Single Maiden name: none Is patient pregnant?: No Pregnancy Status: No Living Arrangements: Alone (in motel) Can pt return to current living arrangement?: Yes Admission Status: Voluntary Is patient capable of signing voluntary admission?: Yes Referral Source: Self/Family/Friend Insurance type: self pay  Medical Screening Exam [facility] Walk-in ONLY) Medical Exam completed: Yes  Crisis Care  Plan Living Arrangements: Alone (in motel) Name of Psychiatrist: none Name of Therapist: none  Education Status Is patient currently in school?: No Highest grade of school patient has completed: GED  Risk to self with the past 6 months Suicidal Ideation: No Has patient been a risk to self within the past 6 months prior to admission? : No Suicidal Intent: No Has patient had any suicidal intent within the past 6 months  prior to admission? : No Is patient at risk for suicide?: No Suicidal Plan?: No Has patient had any suicidal plan within the past 6 months prior to admission? : No Access to Means: No What has been your use of drugs/alcohol within the last 12 months?: daily alcohol use, 3 day crack binge Previous Attempts/Gestures: No How many times?: 0 Other Self Harm Risks: none Triggers for Past Attempts:  (n/a) Intentional Self Injurious Behavior: None Family Suicide History: No Recent stressful life event(s):  (wants to get away from [place] drug scene) Persecutory voices/beliefs?: No Depression: Yes Depression Symptoms: Isolating, Loss of interest in usual pleasures Substance abuse history and/or treatment for substance abuse?: Yes Suicide prevention information given to non-admitted patients: Not applicable  Risk to Others within the past 6 months Homicidal Ideation: No Does patient have any lifetime risk of violence toward others beyond the six months prior to admission? : No Thoughts of Harm to Others: No Current Homicidal Intent: No Current Homicidal Plan: No Access to Homicidal Means: No Identified Victim: none History of harm to others?: No Assessment of Violence: None Noted Violent Behavior Description: pt denies hx of assault  Does patient have access to weapons?: No Criminal Charges Pending?: No Does patient have a court date: No Is patient on probation?: No  Psychosis Hallucinations: None noted Delusions: None noted  Mental Status Report Appearance/Hygiene: Unremarkable Eye Contact: Good Motor Activity: Freedom of movement Speech: Logical/coherent Level of Consciousness: Alert Mood:  ("just foggy") Affect: Appropriate to circumstance Anxiety Level: Minimal Thought Processes: Relevant, Coherent Judgement: Impaired Orientation: Person, Place, Time, Situation Obsessive Compulsive Thoughts/Behaviors: None  Cognitive Functioning Concentration: Decreased Memory: Remote  Intact, Recent Intact IQ: Average Insight: Fair Impulse Control: Poor Appetite: Fair Sleep: Decreased Total Hours of Sleep: 0 (in 3 days) Vegetative Symptoms: None  ADLScreening [facility] Assessment Services) Patient's cognitive ability adequate to safely complete daily activities?: Yes Patient able to express need for assistance with ADLs?: Yes Independently performs ADLs?: Yes (appropriate for developmental age)  Prior Inpatient Therapy Prior Inpatient Therapy: Yes Prior Therapy Dates: 2013, 2015, 2016, 2017 [facility] Reason for Treatment: substance abuse treatment  Prior Outpatient Therapy Prior Outpatient Therapy: No Prior Therapy Dates: na Prior Therapy Facilty/Provider(s): na Reason for Treatment: na Does patient have an ACCT team?: No Does patient have Intensive In-House Services?  : No Does patient have [facility] services? : No Does patient have P4CC services?: No  ADL Screening (condition at time of admission) Patient's cognitive ability adequate to safely complete daily activities?: Yes Patient able to express need for assistance with ADLs?: Yes Independently performs ADLs?: Yes (appropriate for developmental age)             Advance Directives (For Healthcare) Does patient have an advance directive?: No Would patient like information on creating an advanced directive?: No - patient declined information    Additional Information 1:1 In Past 12 Months?: No CIRT Risk: No Elopement Risk: No Does patient have medical clearance?: No   Disposition:  Disposition Initial Assessment Completed for this  Encounter: Yes Disposition of Patient: Outpatient treatment Type of outpatient treatment: Adult (laura davis NP recommends d/c with outpatient treatment)  Fransisca KaufmannLaura Davis NP recommends outpatient SA treatment for pt. Writer called [facility] but Trula OreChristina reports they don't have beds. Writer [facility] w/ Clifton [facility]aron [facility]  but they have a two week waiting list and he isn't taking any referrals for initial screenings. Writer [facility] but [facility] wouldn't be able to go there either. Writer gave pt info for [facility] which has a walk in clinic.  Jay Potts P 10/14/2016 1:10 PM

## 2016-12-29 IMAGING — DX DG ELBOW COMPLETE 3+V*L*
4 series · 4 of 4 positions shown · non-contrast
Comparison: None.

CLINICAL DATA: Posterior elbow pain and swelling for 1 week, pain
post martial arts training

EXAM:
LEFT ELBOW - COMPLETE 3+ VIEW

[elbow ap]
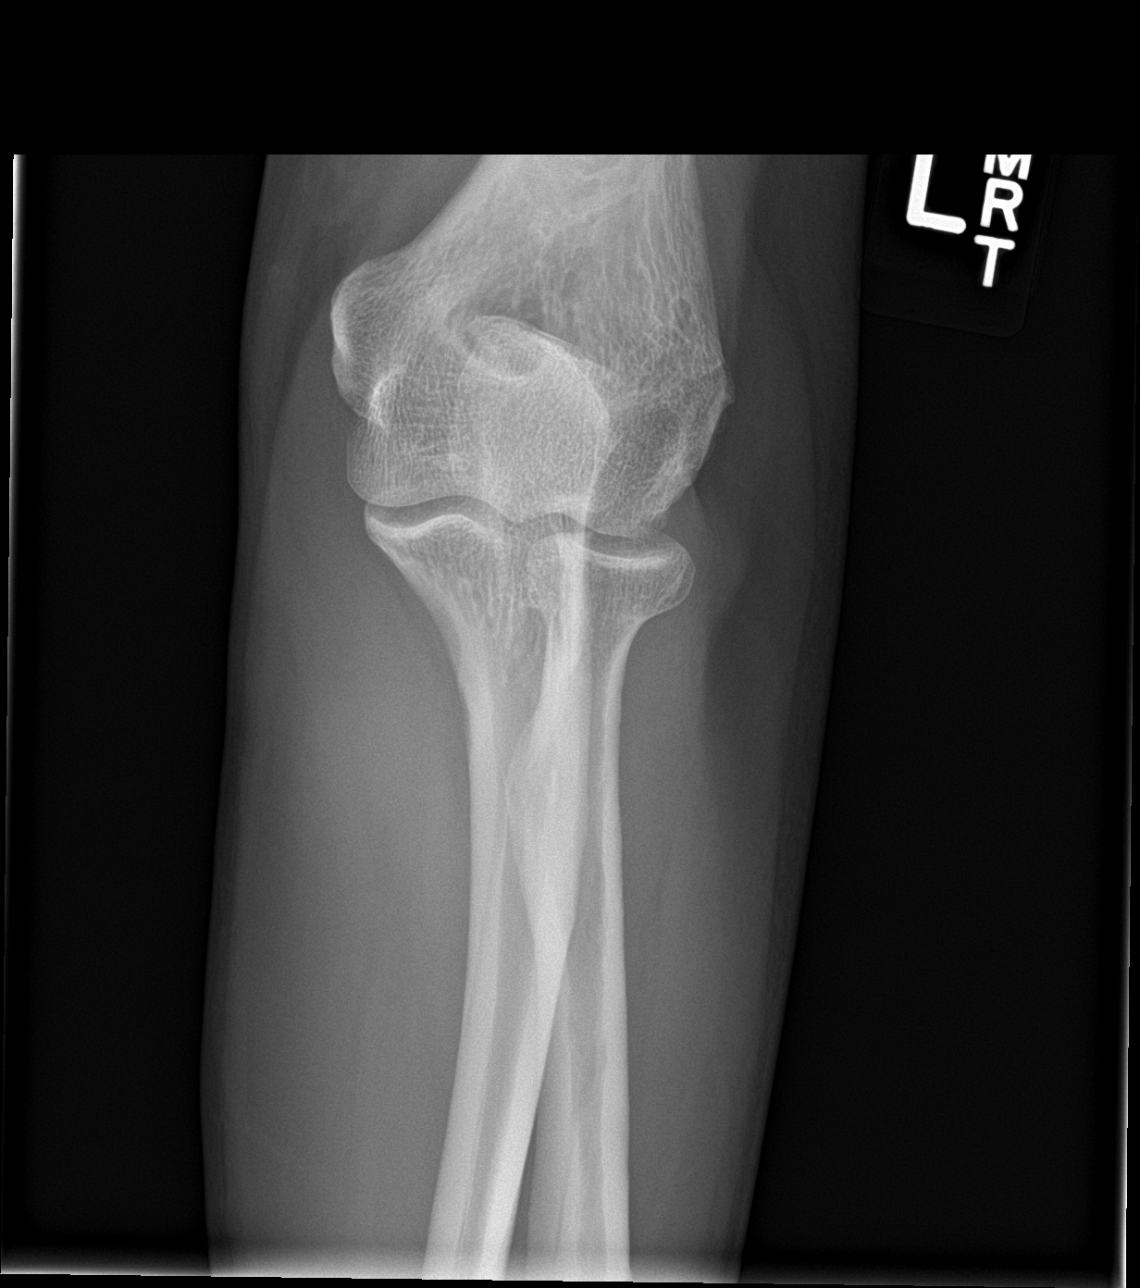

[elbow obl (1 of 2)]
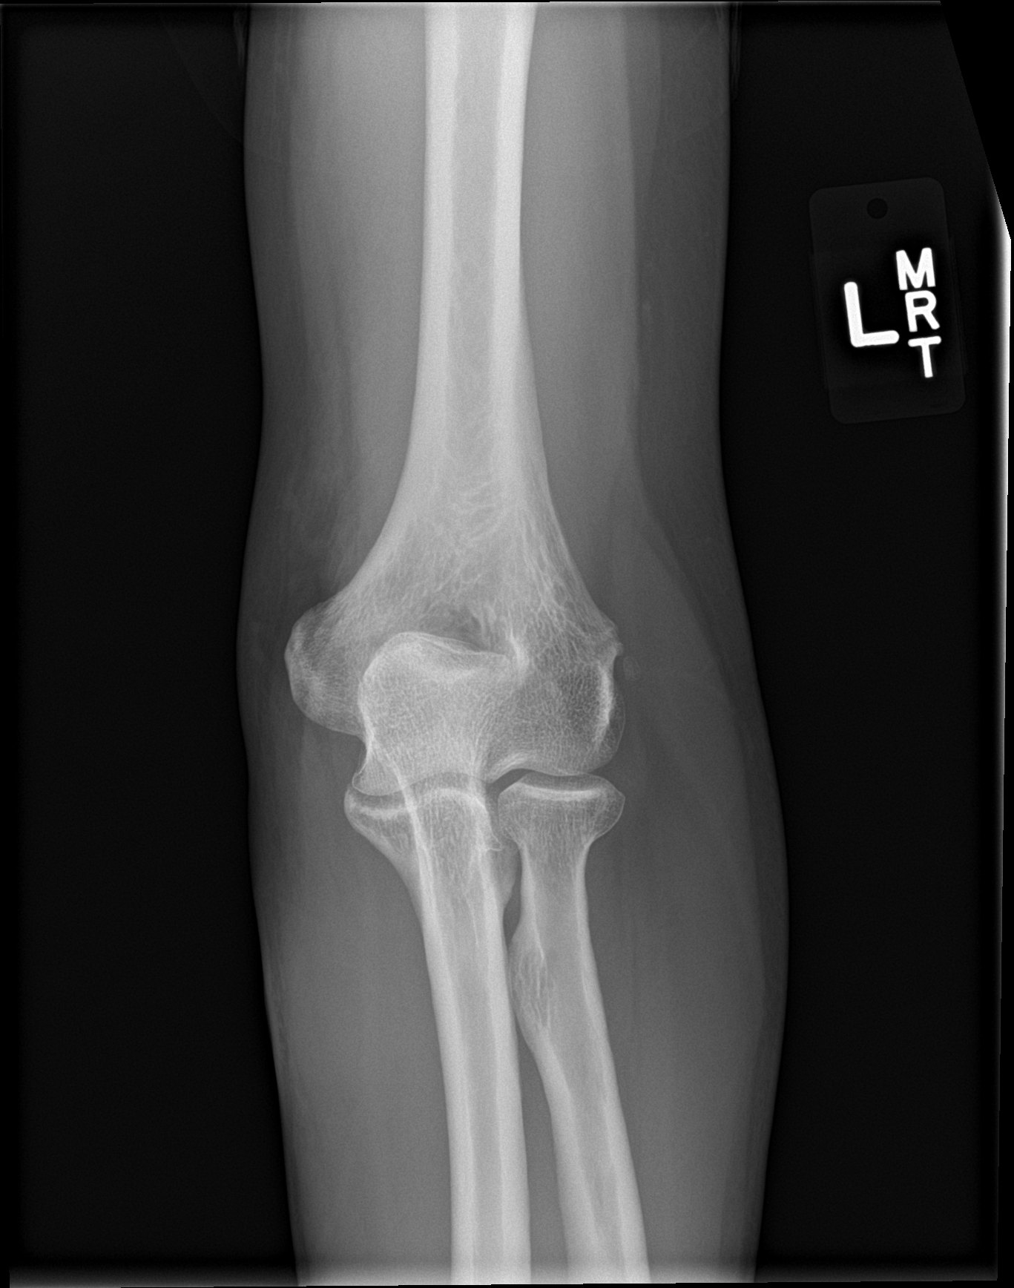

[elbow obl (2 of 2)]
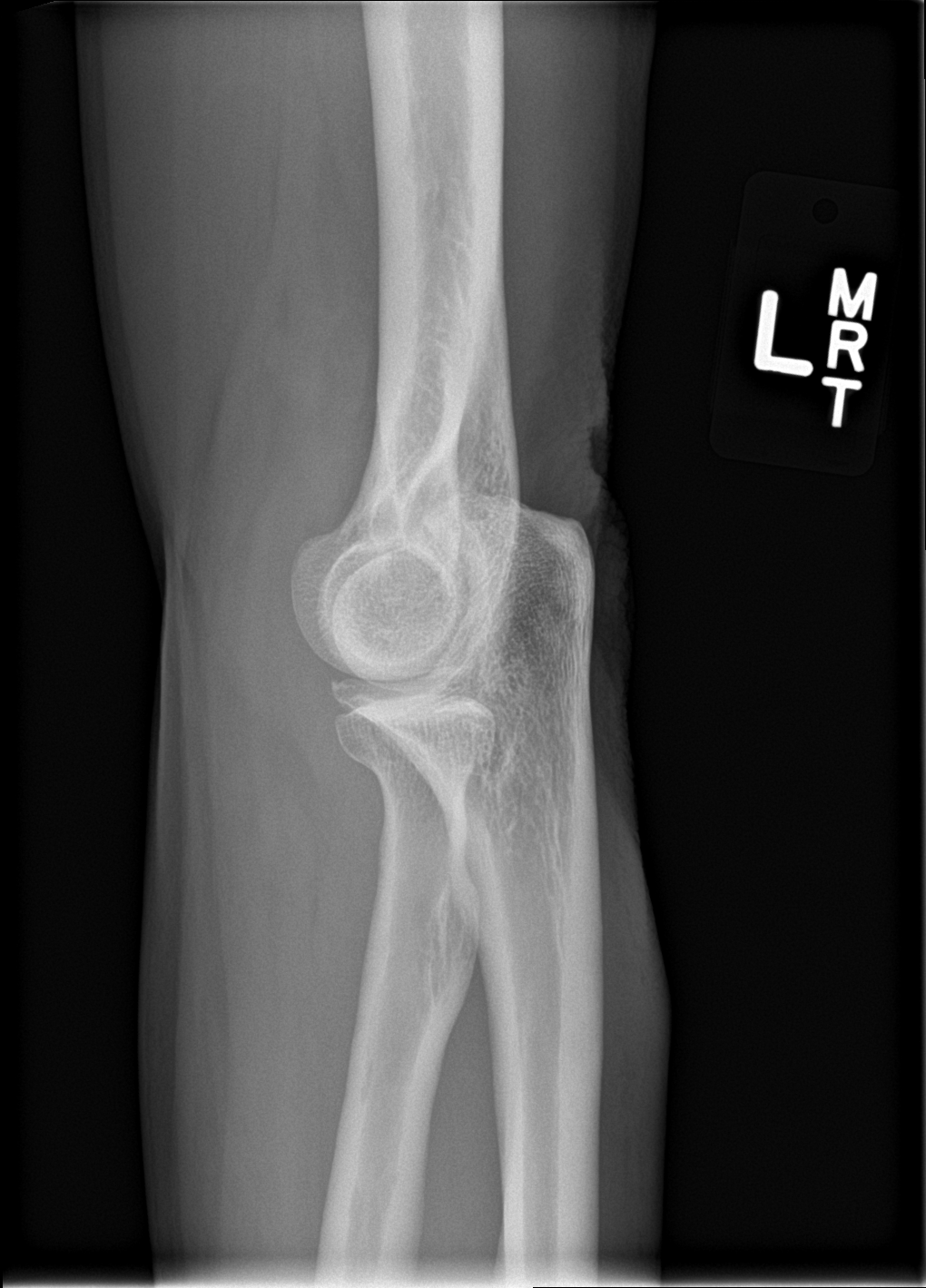

[elbow lat]
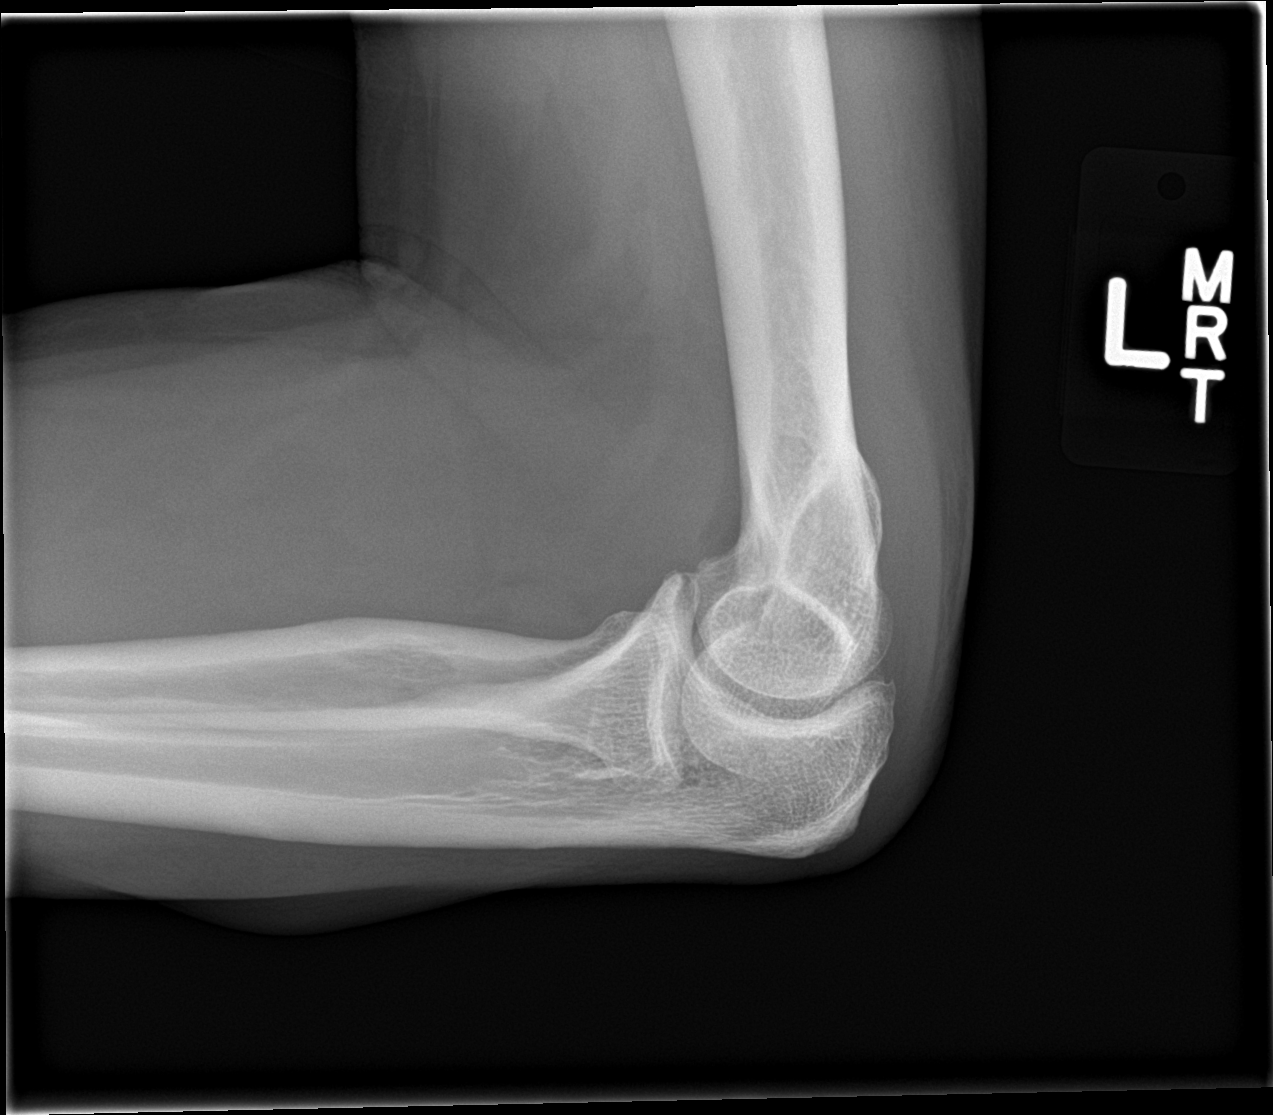

[4 of 4 positions shown; findings below may reference images not displayed]

FINDINGS: Four views of left elbow submitted. No acute fracture or
subluxation. Mild dorsal elbow soft tissue swelling. No joint
effusion.
IMPRESSION: No acute fracture or subluxation. Mild dorsal elbow soft tissue
swelling.

## 2017-11-16 IMAGING — CR DG CHEST 2V
2 series · 2 of 2 positions shown · non-contrast
Comparison: None.

CLINICAL DATA: Acute onset of cough. Cocaine detox. Initial
encounter.

EXAM:
CHEST  2 VIEW

[w chest lat]
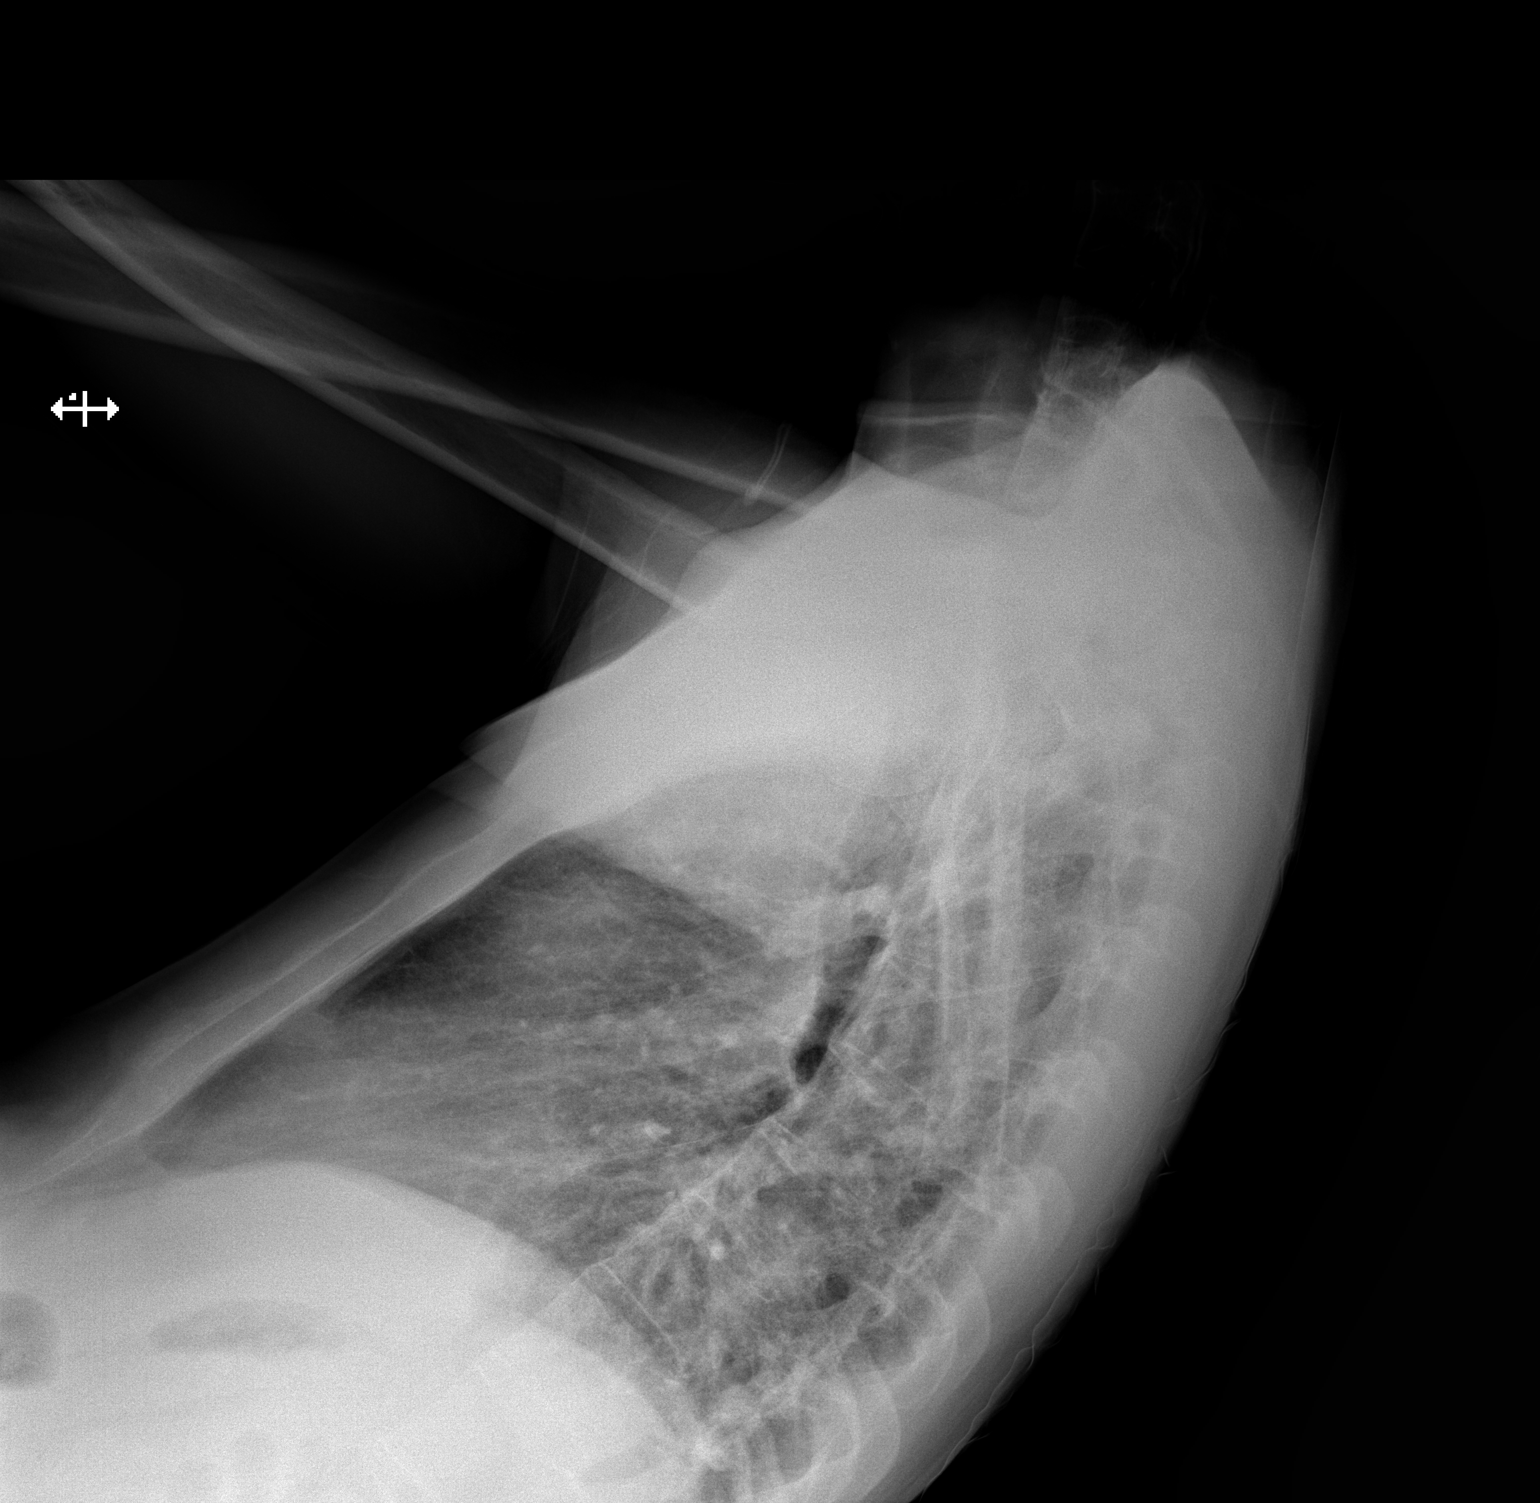

[x chest ap]
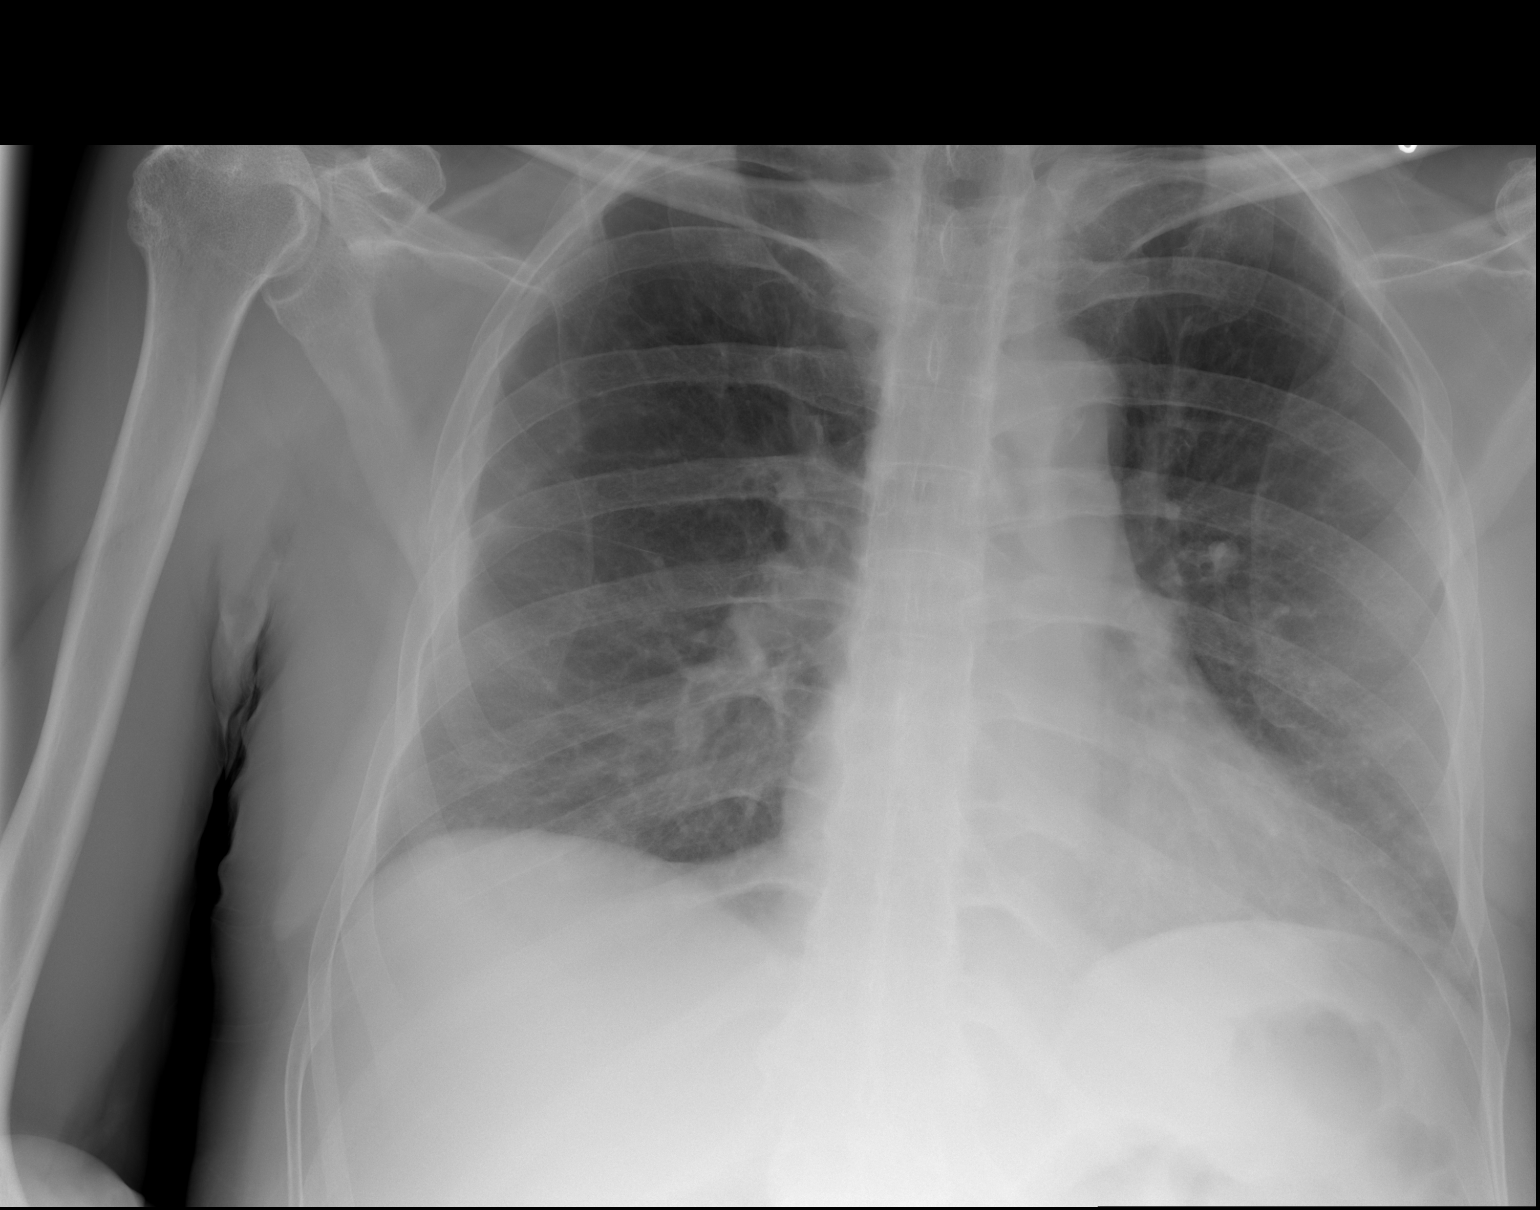

[2 of 2 positions shown; findings below may reference images not displayed]

FINDINGS: The lungs are well-aerated. Minimal left basilar atelectasis is
noted. There is no evidence of pleural effusion or pneumothorax.

The heart is normal in size; the mediastinal contour is within
normal limits. No acute osseous abnormalities are seen.
IMPRESSION: Minimal left basilar atelectasis noted.  Lungs otherwise clear.
